# Patient Record
Sex: Male | Born: 1975 | Race: White | Hispanic: No | Smoking: Never smoker
Health system: Southern US, Community
[De-identification: ages and names within clinical notes are randomized; demographics above are authoritative.]

## PROBLEM LIST (undated history)

## (undated) DIAGNOSIS — J45909 Unspecified asthma, uncomplicated: Secondary | ICD-10-CM

## (undated) DIAGNOSIS — J40 Bronchitis, not specified as acute or chronic: Secondary | ICD-10-CM

## (undated) HISTORY — PX: ABDOMINAL SURGERY: SHX537

---

## 2008-02-17 DIAGNOSIS — K429 Umbilical hernia without obstruction or gangrene: Secondary | ICD-10-CM

## 2008-02-17 HISTORY — PX: ABDOMINAL SURGERY: SHX537

## 2008-02-17 HISTORY — PX: UMBILICAL HERNIA REPAIR: SHX196

## 2008-02-17 HISTORY — DX: Umbilical hernia without obstruction or gangrene: K42.9

## 2010-03-06 ENCOUNTER — Ambulatory Visit: Payer: Self-pay | Admitting: Surgery

## 2010-03-11 ENCOUNTER — Ambulatory Visit: Payer: Self-pay | Admitting: Surgery

## 2012-10-22 ENCOUNTER — Emergency Department: Payer: Self-pay | Admitting: Emergency Medicine

## 2013-02-20 ENCOUNTER — Emergency Department: Payer: Self-pay | Admitting: Emergency Medicine

## 2013-02-20 LAB — BASIC METABOLIC PANEL
Anion Gap: 6 — ABNORMAL LOW (ref 7–16)
BUN: 12 mg/dL (ref 7–18)
CHLORIDE: 106 mmol/L (ref 98–107)
CO2: 25 mmol/L (ref 21–32)
Calcium, Total: 8.7 mg/dL (ref 8.5–10.1)
Creatinine: 0.86 mg/dL (ref 0.60–1.30)
EGFR (African American): >60
EGFR (Non-African Amer.): >60
GLUCOSE: 98 mg/dL (ref 65–99)
Osmolality: 274 (ref 275–301)
Potassium: 3.3 mmol/L — ABNORMAL LOW (ref 3.5–5.1)
SODIUM: 137 mmol/L (ref 136–145)

## 2013-02-20 LAB — CBC
HCT: 39.3 % — ABNORMAL LOW (ref 40.0–52.0)
HGB: 14 g/dL (ref 13.0–18.0)
MCH: 29.4 pg (ref 26.0–34.0)
MCHC: 35.6 g/dL (ref 32.0–36.0)
MCV: 83 fL (ref 80–100)
PLATELETS: 253 10*3/uL (ref 150–440)
RBC: 4.76 10*6/uL (ref 4.40–5.90)
RDW: 12.8 % (ref 11.5–14.5)
WBC: 9 10*3/uL (ref 3.8–10.6)

## 2013-02-20 LAB — TROPONIN I: Troponin-I: <0.02 ng/mL

## 2013-02-21 LAB — TROPONIN I

## 2013-02-24 ENCOUNTER — Ambulatory Visit: Payer: Self-pay | Admitting: Cardiovascular Disease

## 2014-03-19 ENCOUNTER — Emergency Department: Payer: Self-pay | Admitting: Emergency Medicine

## 2014-09-21 IMAGING — CR DG FEMUR 2V*R*
1 series · 4 of 4 positions shown · non-contrast
Comparison: none

REASON FOR EXAM: fall, right hip pain
COMMENTS:

[Series 1: t femur proximal ap right · 0.14mm/px · 4 of 4 slices shown]
[im 1/4]
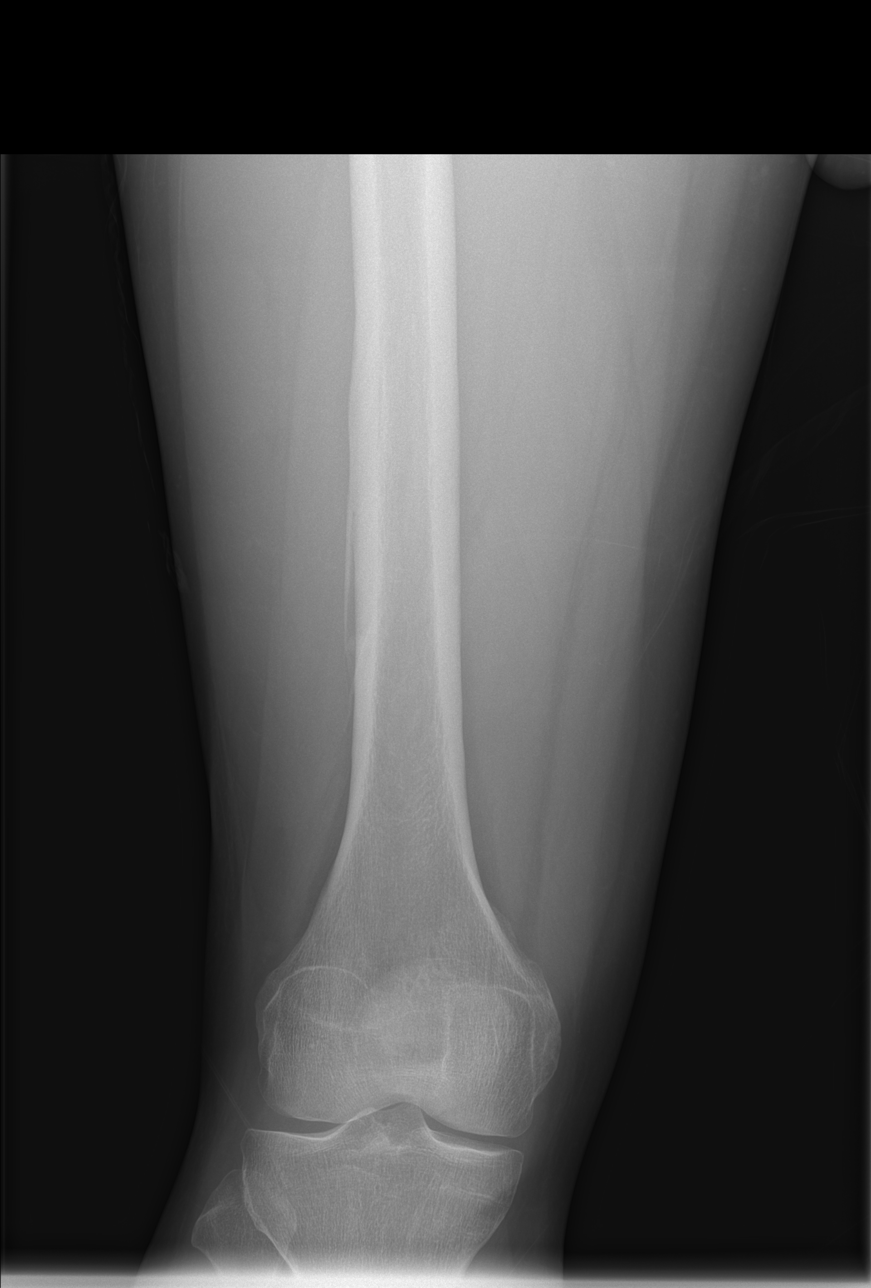
[im 2/4]
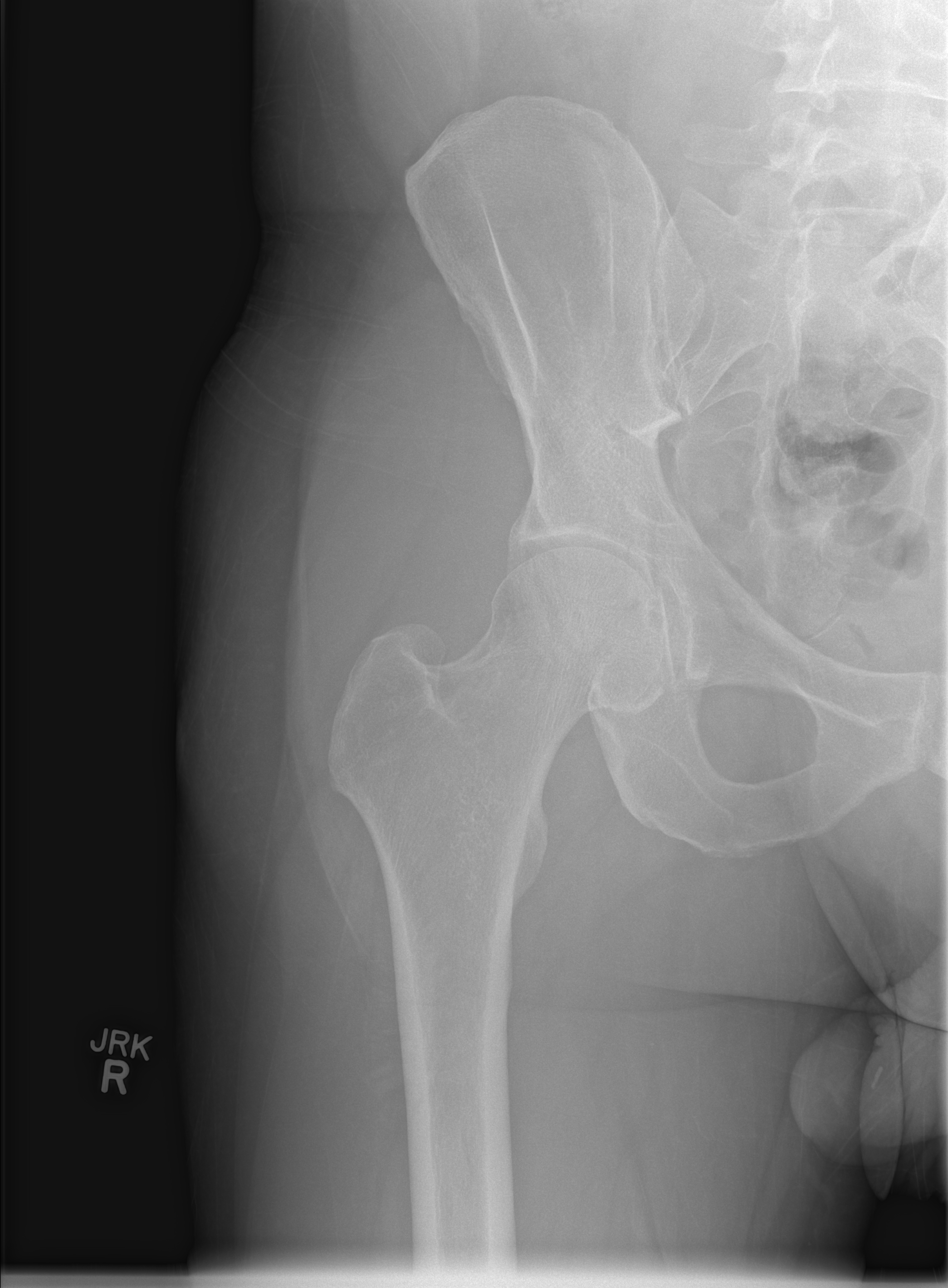
[im 3/4]
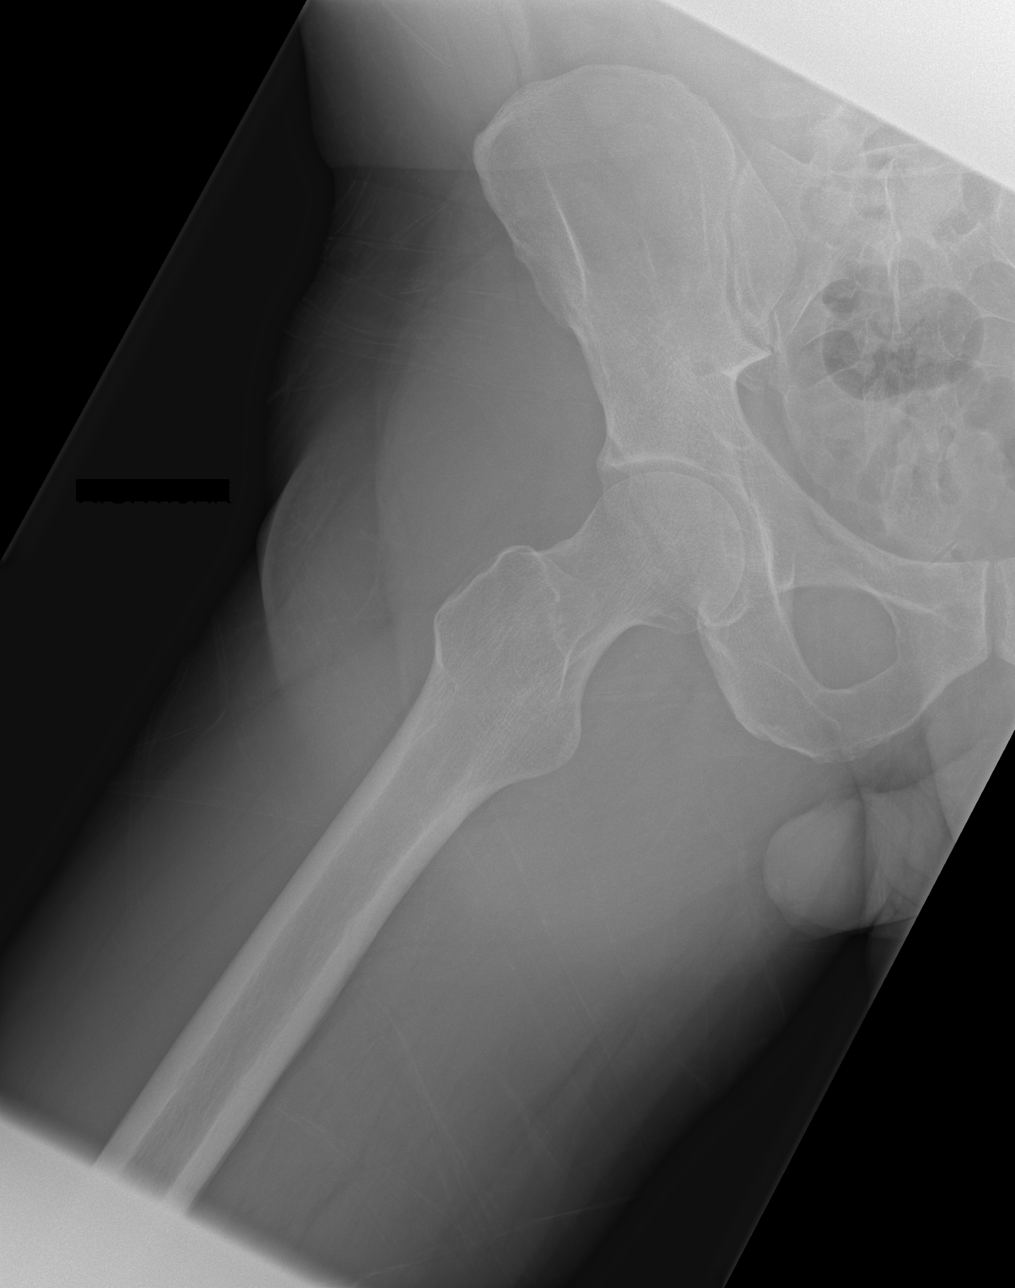
[im 4/4]
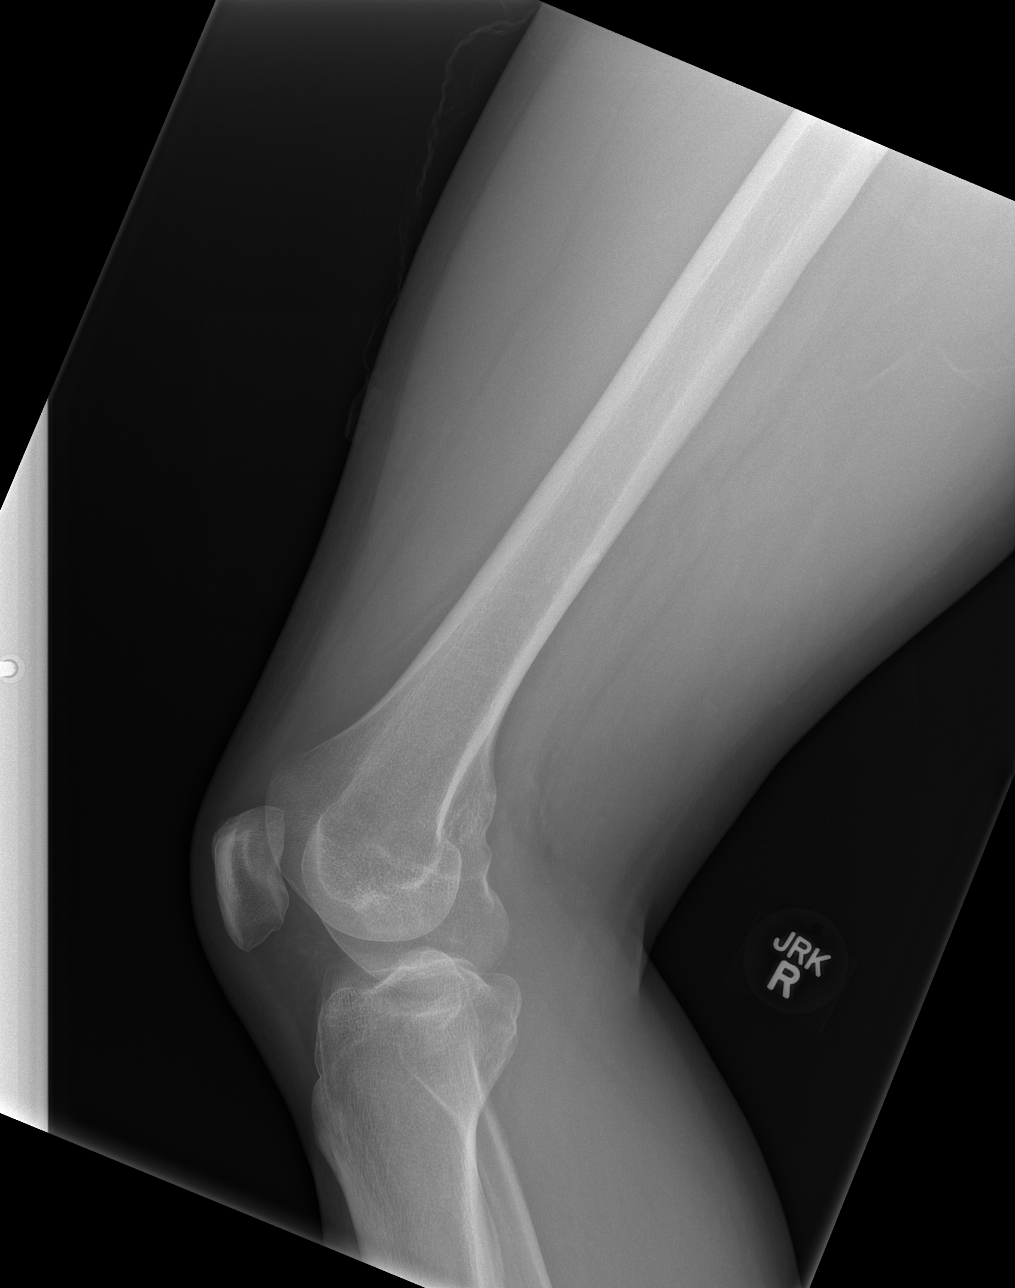

[4 of 4 positions shown; findings below may reference images not displayed]

PROCEDURE:     DXR - DXR FEMUR RIGHT  - October 22, 2012  [DATE]

RESULT:     Images of the right femur demonstrate no dislocation. No
cortical disruption is seen. There is an appearance laterally in the mid to
distal portion of the right femur suggestive of periosteal elevation.
Correlate clinically. No definite abnormality is seen in this region on the
lateral view.
IMPRESSION: Possible periosteal elevation in the distal third of the
lateral aspect of the right femoral shaft.

[REDACTED]

## 2016-04-20 ENCOUNTER — Emergency Department: Payer: Managed Care, Other (non HMO)

## 2016-04-20 ENCOUNTER — Encounter: Payer: Self-pay | Admitting: *Deleted

## 2016-04-20 ENCOUNTER — Emergency Department
Admission: EM | Admit: 2016-04-20 | Discharge: 2016-04-20 | Disposition: A | Discharge status: Home / Self Care | Payer: Managed Care, Other (non HMO) | Attending: Emergency Medicine | Admitting: Emergency Medicine

## 2016-04-20 DIAGNOSIS — J40 Bronchitis, not specified as acute or chronic: Secondary | ICD-10-CM

## 2016-04-20 DIAGNOSIS — R0602 Shortness of breath: Secondary | ICD-10-CM | POA: Diagnosis present

## 2016-04-20 LAB — BASIC METABOLIC PANEL WITH GFR
Anion gap: 4 — ABNORMAL LOW (ref 5–15)
BUN: 13 mg/dL (ref 6–20)
CO2: 28 mmol/L (ref 22–32)
Calcium: 9.2 mg/dL (ref 8.9–10.3)
Chloride: 107 mmol/L (ref 101–111)
Creatinine, Ser: 0.89 mg/dL (ref 0.61–1.24)
GFR calc Af Amer: >60 mL/min
GFR calc non Af Amer: >60 mL/min
Glucose, Bld: 79 mg/dL (ref 65–99)
Potassium: 3.6 mmol/L (ref 3.5–5.1)
Sodium: 139 mmol/L (ref 135–145)

## 2016-04-20 LAB — CBC
HCT: 42.9 % (ref 40.0–52.0)
Hemoglobin: 15 g/dL (ref 13.0–18.0)
MCH: 28.8 pg (ref 26.0–34.0)
MCHC: 35.1 g/dL (ref 32.0–36.0)
MCV: 82.1 fL (ref 80.0–100.0)
Platelets: 213 10*3/uL (ref 150–440)
RBC: 5.22 MIL/uL (ref 4.40–5.90)
RDW: 13.2 % (ref 11.5–14.5)
WBC: 7.9 10*3/uL (ref 3.8–10.6)

## 2016-04-20 LAB — TROPONIN I: Troponin I: <0.03 ng/mL

## 2016-04-20 MED ORDER — AZITHROMYCIN 250 MG PO TABS: ORAL_TABLET | ORAL | 0 refills | Status: DC

## 2016-04-20 MED ORDER — PREDNISONE 10 MG PO TABS: 50.0000 mg | ORAL_TABLET | Freq: Every day | ORAL | 0 refills | Status: DC

## 2016-04-20 MED ORDER — ALBUTEROL SULFATE HFA 108 (90 BASE) MCG/ACT IN AERS: 2.0000 | INHALATION_SPRAY | RESPIRATORY_TRACT | 1 refills | Status: DC | PRN

## 2016-04-20 MED ORDER — PREDNISONE 20 MG PO TABS
60.0000 mg | ORAL_TABLET | Freq: Once | ORAL | Status: AC
  Administered 2016-04-20: 60 mg via ORAL
  Filled 2016-04-20: qty 3

## 2016-04-20 MED ORDER — PHENYLEPHRINE HCL 0.5 % NA SOLN
1.0000 [drp] | Freq: Once | NASAL | Status: AC
  Administered 2016-04-20: 1 [drp] via NASAL
  Filled 2016-04-20: qty 15

## 2016-04-20 MED ORDER — IPRATROPIUM-ALBUTEROL 0.5-2.5 (3) MG/3ML IN SOLN
3.0000 mL | Freq: Once | RESPIRATORY_TRACT | Status: AC
  Administered 2016-04-20: 3 mL via RESPIRATORY_TRACT
  Filled 2016-04-20: qty 3

## 2016-04-20 NOTE — Discharge Instructions (Signed)
Please follow up with the primary care provider for choice for symptoms that are not improving over the week. Return to the emergency department for symptoms that change or worsen if you are unable to schedule an appointment.

## 2016-04-20 NOTE — ED Notes (Signed)
Provider at bedside

## 2016-04-20 NOTE — ED Provider Notes (Signed)
[facility] Emergency Department Provider Note  ____________________________________________  Time seen: Approximately 9:42 PM  I have reviewed the triage vital signs and the nursing notes.   HISTORY  Chief Complaint Shortness of Breath   HPI Casandra DoffingDouglas G Christenson Jr. is a 41 y.o. male who presents to the emergency department for evaluation of shortness of breath and wheezing. Symptoms started about 2 days ago with acute worsening over the past 24 hours. He states shortness of breath worsens with lying flat. He denies history of asthma and does not smoke. He states cough is infrequent and non-productive. He denies fever.  He has not taken any over the counter medications for his symptoms.   No past medical history on file.  There are no active problems to display for this patient.   No past surgical history on file.  Prior to Admission medications   Medication Sig Start Date End Date Taking? Authorizing Provider  albuterol (PROVENTIL HFA;VENTOLIN HFA) 108 (90 Base) MCG/ACT inhaler Inhale 2 puffs into the lungs every 4 (four) hours as needed for wheezing or shortness of breath. 04/20/16   Chinita Pesterari B Jacorian Golaszewski, FNP  azithromycin (ZITHROMAX) 250 MG tablet 2 tablets today, then 1 tablet for the next 4 days. 04/20/16   Chinita Pesterari B Meaghann Choo, FNP  predniSONE (DELTASONE) 10 MG tablet Take 5 tablets (50 mg total) by mouth daily. 04/20/16   Chinita Pesterari B Estefani Bateson, FNP    Allergies Patient has no known allergies.  No family history on file.  Social History Social History  Substance Use Topics  . Smoking status: Never Smoker  . Smokeless tobacco: Never Used  . Alcohol use No    Review of Systems Constitutional: Negative for fever/chills ENT: negative for sore throat. Cardiovascular: Denies chest pain. Respiratory: Positive for shortness of breath. Positive for cough. Gastrointestinal: negative for nausea,  Negative for vomiting.  Negative for diarrhea.  Musculoskeletal: Negative  for body aches Skin: Negative for rash. Neurological: Negative for headaches ____________________________________________   PHYSICAL EXAM:  VITAL SIGNS: ED Triage Vitals  Enc Vitals Group     BP 04/20/16 2049 112/71     Pulse Rate 04/20/16 2049 77     Resp 04/20/16 2049 20     Temp 04/20/16 2049 98.2 F (36.8 C)     Temp Source 04/20/16 2049 Oral     SpO2 04/20/16 2049 95 %     Weight 04/20/16 2051 190 lb (86.2 kg)     Height 04/20/16 2051 5\' 6"  (1.676 m)     Head Circumference --      Peak Flow --      Pain Score 04/20/16 2051 3     Pain Loc --      Pain Edu? --      Excl. in GC? --     Constitutional: Alert and oriented. Well appearing and in no acute distress. Eyes: Conjunctivae are normal. EOMI. Nose: Nasal congestion noted; no rhinnorhea. Mouth/Throat: Mucous membranes are moist.  Oropharynx normal. Tonsils not visualized. Neck: No stridor.  Lymphatic: No cervical lymphadenopathy. Cardiovascular: Normal rate, regular rhythm. Good peripheral circulation. Respiratory: Normal respiratory effort.  No retractions. Decreased breath sounds with scattered wheezing on the left. Breath sounds on the right also diffuse wheezes, but moving more air. Gastrointestinal: Soft and nontender.  Musculoskeletal: FROM x 4 extremities.  Neurologic:  Normal speech and language.  Skin:  Skin is warm, dry and intact. No rash noted. Psychiatric: Mood and affect are normal. Speech and behavior are normal.  ____________________________________________   LABS (all labs ordered are listed, but only abnormal results are displayed)  Labs Reviewed  BASIC METABOLIC PANEL - Abnormal; Notable for the following:       Result Value   Anion gap 4 (*)    All other components within normal limits  CBC  TROPONIN I   ____________________________________________  EKG  Ventricular rate of 73; PR , QRS 84ms, QT 60ms, normal sinus  rhythm ____________________________________________  RADIOLOGY  Chest x-ray without acute findings per radiology. ____________________________________________   PROCEDURES  Procedure(s) performed:   Critical Care performed: No ____________________________________________   INITIAL IMPRESSION / ASSESSMENT AND PLAN / ED COURSE  42 year old male presenting to the emergency department for evaluation of shortness of breath. Symptoms started approximately 10 days ago with increase of orthopnea over the past 24 hours. While in the emergency department, he was given a DuoNeb treatment, 60 mg of prednisone, and a single dose of Afrin for his complaint of nasal congestion after the breathing treatment. He is to follow-up with the primary care provider for his choice for symptoms that are not improving over the week. He is to return to the emergency department for symptoms that change or worsen if he is unable schedule an appointment.  Pertinent labs & imaging results that were available during my care of the patient were reviewed by me and considered in my medical decision making (see chart for details).  New Prescriptions   ALBUTEROL (PROVENTIL HFA;VENTOLIN HFA) 108 (90 BASE) MCG/ACT INHALER    Inhale 2 puffs into the lungs every 4 (four) hours as needed for wheezing or shortness of breath.   AZITHROMYCIN (ZITHROMAX) 250 MG TABLET    2 tablets today, then 1 tablet for the next 4 days.   PREDNISONE (DELTASONE) 10 MG TABLET    Take 5 tablets (50 mg total) by mouth daily.    If controlled substance prescribed during this visit, 12 month history viewed on the [facility] prior to issuing an initial prescription for Schedule II or III opiod. ____________________________________________   FINAL CLINICAL IMPRESSION(S) / ED DIAGNOSES  Final diagnoses:  Bronchitis    Note:  This document was prepared using Dragon voice recognition software and may include unintentional dictation errors.     Chinita Pester, FNP 04/20/16 2255    Jeanmarie Plant, MD 04/20/16 [phone]

## 2016-04-20 NOTE — ED Triage Notes (Signed)
Pt ambulatory to triage  Pt states he is sob for 2days   no chest pain.  Pt states sx worse when lying down.  Dry cough  No n/v/d.  Pt alert.  Speech clear.

## 2016-04-26 ENCOUNTER — Encounter (HOSPITAL_COMMUNITY): Payer: Self-pay | Admitting: Emergency Medicine

## 2016-04-26 ENCOUNTER — Ambulatory Visit (HOSPITAL_COMMUNITY)
Admission: EM | Admit: 2016-04-26 | Discharge: 2016-04-26 | Disposition: A | Discharge status: Home / Self Care | Payer: Managed Care, Other (non HMO) | Attending: Nurse Practitioner | Admitting: Nurse Practitioner

## 2016-04-26 DIAGNOSIS — J209 Acute bronchitis, unspecified: Secondary | ICD-10-CM

## 2016-04-26 MED ORDER — ALBUTEROL SULFATE HFA 108 (90 BASE) MCG/ACT IN AERS: 2.0000 | INHALATION_SPRAY | Freq: Four times a day (QID) | RESPIRATORY_TRACT | 2 refills | Status: DC | PRN

## 2016-04-26 MED ORDER — HYDROCOD POLST-CPM POLST ER 10-8 MG/5ML PO SUER: 5.0000 mL | Freq: Two times a day (BID) | ORAL | 0 refills | Status: AC | PRN

## 2016-04-26 NOTE — Discharge Instructions (Signed)
I believe the you have acute bronchitis today. He states the cough medicine as prescribed. Please take the computer inhaler as needed. Please return if no improvement is noted. He may continue the Delsym and mucinex as well over-the-counter.

## 2016-04-26 NOTE — ED Provider Notes (Signed)
CSN: [id number]     Arrival date & time 04/26/16  1452 History   First MD Initiated Contact with Patient 04/26/16 1524     Chief Complaint  Patient presents with  . Cough   (Consider location/radiation/quality/duration/timing/severity/associated sxs/prior Treatment) Is a well-appearing 41 year old male, with no medical history, presents today for a cough that is not almost 2 weeks accompanied by shortness of breath only at night when he is laying down. She was seen in the ER on 03/05 for the same thing and was diagnosed with bronchitis and was sent home with prednisone. She finished the prednisone today. Patient had a negative chest x-ray at the ER. States that his cough is getting worse. Patient have try over-the-counter Delsym with no relief. He denies shortness of breath currently. He denies chest pain. Nonsmoker. He also denies sore throat, congestion, runny nose or sneezing.  HR is 90 at repeat        History reviewed. No pertinent past medical history. Past Surgical History:  Procedure Laterality Date  . ABDOMINAL SURGERY     History reviewed. No pertinent family history. Social History  Substance Use Topics  . Smoking status: Never Smoker  . Smokeless tobacco: Never Used  . Alcohol use No    Review of Systems  Constitutional:       See history of present illness    Allergies  Patient has no known allergies.  Home Medications   Prior to Admission medications   Medication Sig Start Date End Date Taking? Authorizing Provider  predniSONE (DELTASONE) 10 MG tablet Take 5 tablets (50 mg total) by mouth daily. 04/20/16  Yes Cari B Triplett, FNP  albuterol (PROVENTIL HFA;VENTOLIN HFA) 108 (90 Base) MCG/ACT inhaler Inhale 2 puffs into the lungs every 6 (six) hours as needed for wheezing or shortness of breath. 04/26/16   Lucia Estelle, NP  chlorpheniramine-HYDROcodone [facility] PENNKINETIC ER) 10-8 MG/5ML SUER Take 5 mLs by mouth every 12 (twelve) hours as needed for cough.  04/26/16 05/03/16  Lucia Estelle, NP   Meds Ordered and Administered this Visit  Medications - No data to display  BP 118/78 (BP Location: Right Arm)   Pulse 90   Temp 98.6 F (37 C) (Oral)   SpO2 95%  No data found.   Physical Exam  Constitutional: He is oriented to person, place, and time. He appears well-developed and well-nourished.  HENT:  Head: Normocephalic and atraumatic.  Right Ear: External ear normal.  Left Ear: External ear normal.  Nose: Nose normal.  Mouth/Throat: Oropharynx is clear and moist. No oropharyngeal exudate.  TM pearly gray bilaterally  Eyes: Conjunctivae are normal. Pupils are equal, round, and reactive to light.  Neck: Normal range of motion.  Cardiovascular: Normal rate, regular rhythm and normal heart sounds.   Pulmonary/Chest: Effort normal and breath sounds normal. No respiratory distress. He has no wheezes.  Abdominal: Soft. Bowel sounds are normal. He exhibits no distension. There is no tenderness.  Neurological: He is alert and oriented to person, place, and time.  Skin: Skin is warm and dry. He is not diaphoretic.  Nursing note and vitals reviewed.   Urgent Care Course     Procedures (including critical care time)  Labs Review Labs Reviewed - No data to display  Imaging Review No results found.  MDM   1. Acute bronchitis, unspecified organism    Chest x-ray was negative at the ER. There is no indication for repeat chest x-ray today. Patient's symptoms are most consistent with acute bronchitis.  Education provided on acute bronchitis.  Please start the cough medicine as prescribed. Also do the albuterol inhaler as needed for wheezing or shortness of breath. Please return or go to the ER if SOB gets worst.    Lucia EstelleFeng Judithann Villamar, NP 04/26/16 1538

## 2016-04-26 NOTE — ED Triage Notes (Signed)
Patient presents to [facility] with Cough and Congestion went to [facility] on Monday. He states they gave him an inhaler and some steroids, patient reports symptoms worsened. Patient states he had a low grade fever.   A and O x 4

## 2016-12-27 ENCOUNTER — Encounter: Payer: Self-pay | Admitting: Emergency Medicine

## 2016-12-27 ENCOUNTER — Emergency Department
Admission: EM | Admit: 2016-12-27 | Discharge: 2016-12-27 | Disposition: A | Discharge status: Home / Self Care | Payer: Managed Care, Other (non HMO) | Attending: Emergency Medicine | Admitting: Emergency Medicine

## 2016-12-27 ENCOUNTER — Emergency Department: Payer: Managed Care, Other (non HMO)

## 2016-12-27 DIAGNOSIS — J40 Bronchitis, not specified as acute or chronic: Secondary | ICD-10-CM | POA: Diagnosis not present

## 2016-12-27 DIAGNOSIS — R062 Wheezing: Secondary | ICD-10-CM | POA: Diagnosis present

## 2016-12-27 HISTORY — DX: Bronchitis, not specified as acute or chronic: J40

## 2016-12-27 LAB — TROPONIN I

## 2016-12-27 LAB — CBC WITH DIFFERENTIAL/PLATELET
BASOS ABS: 0 10*3/uL (ref 0–0.1)
BASOS PCT: 0 %
EOS ABS: 0.7 10*3/uL (ref 0–0.7)
Eosinophils Relative: 11 %
HEMATOCRIT: 42.5 % (ref 40.0–52.0)
Hemoglobin: 14.5 g/dL (ref 13.0–18.0)
Lymphocytes Relative: 24 %
Lymphs Abs: 1.6 10*3/uL (ref 1.0–3.6)
MCH: 28.9 pg (ref 26.0–34.0)
MCHC: 34.2 g/dL (ref 32.0–36.0)
MCV: 84.5 fL (ref 80.0–100.0)
MONO ABS: 0.5 10*3/uL (ref 0.2–1.0)
Monocytes Relative: 8 %
NEUTROS ABS: 3.6 10*3/uL (ref 1.4–6.5)
NEUTROS PCT: 57 %
PLATELETS: 212 10*3/uL (ref 150–440)
RBC: 5.02 MIL/uL (ref 4.40–5.90)
RDW: 12.9 % (ref 11.5–14.5)
WBC: 6.4 10*3/uL (ref 3.8–10.6)

## 2016-12-27 LAB — BASIC METABOLIC PANEL
ANION GAP: 9 (ref 5–15)
BUN: 14 mg/dL (ref 6–20)
CALCIUM: 8.9 mg/dL (ref 8.9–10.3)
CO2: 25 mmol/L (ref 22–32)
Chloride: 104 mmol/L (ref 101–111)
Creatinine, Ser: 1 mg/dL (ref 0.61–1.24)
Glucose, Bld: 93 mg/dL (ref 65–99)
Potassium: 3.7 mmol/L (ref 3.5–5.1)
SODIUM: 138 mmol/L (ref 135–145)

## 2016-12-27 MED ORDER — IPRATROPIUM-ALBUTEROL 0.5-2.5 (3) MG/3ML IN SOLN
3.0000 mL | Freq: Once | RESPIRATORY_TRACT | Status: AC
  Administered 2016-12-27: 3 mL via RESPIRATORY_TRACT

## 2016-12-27 MED ORDER — IPRATROPIUM-ALBUTEROL 0.5-2.5 (3) MG/3ML IN SOLN
RESPIRATORY_TRACT | Status: AC
  Administered 2016-12-27: 3 mL via RESPIRATORY_TRACT
  Filled 2016-12-27: qty 6

## 2016-12-27 MED ORDER — PREDNISONE 20 MG PO TABS: 60.0000 mg | ORAL_TABLET | Freq: Every day | ORAL | 0 refills | Status: AC

## 2016-12-27 MED ORDER — PREDNISONE 20 MG PO TABS
60.0000 mg | ORAL_TABLET | Freq: Once | ORAL | Status: AC
  Administered 2016-12-27: 60 mg via ORAL

## 2016-12-27 MED ORDER — ALBUTEROL SULFATE HFA 108 (90 BASE) MCG/ACT IN AERS: 2.0000 | INHALATION_SPRAY | Freq: Four times a day (QID) | RESPIRATORY_TRACT | 2 refills | Status: AC | PRN

## 2016-12-27 MED ORDER — PREDNISONE 20 MG PO TABS
ORAL_TABLET | ORAL | Status: AC
  Administered 2016-12-27: 60 mg via ORAL
  Filled 2016-12-27: qty 3

## 2016-12-27 NOTE — ED Provider Notes (Signed)
[facility] Emergency Department Provider Note  ____________________________________________  Time seen: Approximately 9:35 PM  I have reviewed the triage vital signs and the nursing notes.   HISTORY  Chief Complaint No chief complaint on file.   HPI Jeffery DoffingDouglas G Boulanger Jr. is a 41 y.o. male no significant past medical history who presents for evaluation of wheezing and shortness of breath. Patient reports that 5 months ago he had similar symptoms and he was told he had bronchitis. He was given an inhaler. He has been using the inhaler which helps some with his shortness of breath. He has had shortness of breath that is mild and constant with wheezing for 2 weeks. For the last 4 days has had a cough productive of yellow sputum. No fever or chills. Has had intermittent chest tightness which is responsive to the albuterol inhaler. No nausea, vomiting, diarrhea, abdominal pain. Patient denies any history of smoking, asthma, or COPD.  Past Medical History:  Diagnosis Date  . Bronchitis     There are no active problems to display for this patient.   Past Surgical History:  Procedure Laterality Date  . ABDOMINAL SURGERY      Prior to Admission medications   Medication Sig Start Date End Date Taking? Authorizing Provider  albuterol (PROVENTIL HFA;VENTOLIN HFA) 108 (90 Base) MCG/ACT inhaler Inhale 2 puffs every 6 (six) hours as needed into the lungs for wheezing or shortness of breath. 12/27/16   Nita SickleVeronese, [place], MD  predniSONE (DELTASONE) 20 MG tablet Take 3 tablets (60 mg total) daily for 4 days by mouth. 12/27/16 12/31/16  Nita SickleVeronese, [place], MD    Allergies Patient has no known allergies.  History reviewed. No pertinent family history.  Social History Social History   Tobacco Use  . Smoking status: Never Smoker  . Smokeless tobacco: Never Used  Substance Use Topics  . Alcohol use: No  . Drug use: No    Review of Systems  Constitutional:  Negative for fever. Eyes: Negative for visual changes. ENT: Negative for sore throat. Neck: No neck pain  Cardiovascular: Negative for chest pain. Respiratory: + shortness of breath, cough, wheezing Gastrointestinal: Negative for abdominal pain, vomiting or diarrhea. Genitourinary: Negative for dysuria. Musculoskeletal: Negative for back pain. Skin: Negative for rash. Neurological: Negative for headaches, weakness or numbness. Psych: No SI or HI  ____________________________________________   PHYSICAL EXAM:  VITAL SIGNS: ED Triage Vitals  Enc Vitals Group     BP 12/27/16 1933 128/78     Pulse Rate 12/27/16 1933 85     Resp 12/27/16 1933 18     Temp 12/27/16 1933 98 F (36.7 C)     Temp Source 12/27/16 1933 Oral     SpO2 12/27/16 1933 100 %     Weight 12/27/16 1933 190 lb (86.2 kg)     Height 12/27/16 1933 5\' 7"  (1.702 m)     Head Circumference --      Peak Flow --      Pain Score 12/27/16 1932 5     Pain Loc --      Pain Edu? --      Excl. in GC? --     Constitutional: Alert and oriented. Well appearing and in no apparent distress. HEENT:      Head: Normocephalic and atraumatic.         Eyes: Conjunctivae are normal. Sclera is non-icteric.       Mouth/Throat: Mucous membranes are moist.       Neck:  Supple with no signs of meningismus. Cardiovascular: Regular rate and rhythm. No murmurs, gallops, or rubs. 2+ symmetrical distal pulses are present in all extremities. No JVD. Respiratory: Normal respiratory effort. Slightly diminished air movement with faint expiratory wheezes  Gastrointestinal: Soft, non tender, and non distended with positive bowel sounds. No rebound or guarding. Musculoskeletal: Nontender with normal range of motion in all extremities. No edema, cyanosis, or erythema of extremities. Neurologic: Normal speech and language. Face is symmetric. Moving all extremities. No gross focal neurologic deficits are appreciated. Skin: Skin is warm, dry and intact.  No rash noted. Psychiatric: Mood and affect are normal. Speech and behavior are normal.  ____________________________________________   LABS (all labs ordered are listed, but only abnormal results are displayed)  Labs Reviewed  TROPONIN I  CBC WITH DIFFERENTIAL/PLATELET  BASIC METABOLIC PANEL   ____________________________________________  EKG  ED ECG REPORT I, Nita Sicklearolina Korey Prashad, the attending physician, personally viewed and interpreted this ECG.  Normal sinus rhythm, rate of 80, normal intervals, normal axis, no ST elevations or depressions. Unchanged from prior from March 2018  ____________________________________________  RADIOLOGY  CXR: negative ____________________________________________   PROCEDURES  Procedure(s) performed: None Procedures Critical Care performed:  None ____________________________________________   INITIAL IMPRESSION / ASSESSMENT AND PLAN / ED COURSE  41 y.o. male no significant past medical history who presents for evaluation of productive cough, wheezing and shortness of breath. Patient is well-appearing, normal work of breathing, normal sats, he does have a faint expiratory wheezes. PERC negative. No h/o smoking, asthma, COPD however has not seen a doctor in many years. CXR with no evidence of pneumonia, CHF, pneumothorax. EKG with no evidence of ischemia. We'll give a DuoNeb and prednisone and reassess. If patient responds to treatment we'll refer him back to his primary care doctor for further evaluation of possible COPD/asthma    _________________________ 9:44 PM on 12/27/2016 -----------------------------------------  Patient received DuoNeb 2 and prednisone with full resolution of his shortness of breath and wheezing. He is moving good air. At this time, with normal labs, chest x-ray, normal vitals, normal work of breathing, and resolution of his symptoms I feel that is safe for patient to be discharged and continue his management as an  outpatient. [facility] to evaluate for any evidence of COPD or asthma. I will provide patient with prescription for prednisone and albuterol inhaler. I have dicussed my customary return precautions with patient.   As part of my medical decision making, I reviewed the following data within the electronic MEDICAL RECORD NUMBER Nursing notes reviewed and incorporated, Labs reviewed , EKG interpreted , Old EKG reviewed, Old chart reviewed, Radiograph reviewed , Notes from prior ED visits and [place] Controlled Substance Database    Pertinent labs & imaging results that were available during my care of the patient were reviewed by me and considered in my medical decision making (see chart for details).    ____________________________________________   FINAL CLINICAL IMPRESSION(S) / ED DIAGNOSES  Final diagnoses:  Bronchitis      NEW MEDICATIONS STARTED DURING THIS VISIT:  This SmartLink is deprecated. Use AVSMEDLIST instead to display the medication list for a patient.   Note:  This document was prepared using Dragon voice recognition software and may include unintentional dictation errors.    Nita SickleVeronese, [place], MD 12/27/16 [phone]

## 2016-12-27 NOTE — ED Notes (Signed)
Pt in with co cough and congestion x 4-5 days, denies a fever. Hx of bronchitis no hx of smoking, has been taking otc cough meds without relief and inhalers that were prescribed a few months ago for bronchitis.

## 2016-12-27 NOTE — ED Triage Notes (Signed)
Pt with nasal congestion noted. Pt states he feels short of breath and has been having intermittent wheezing at night. Pt with strong frequent cough noted. Pt also complains of chest discomfort with shortness of breath. Pt appears in no acute distress.

## 2017-02-16 DIAGNOSIS — J45909 Unspecified asthma, uncomplicated: Secondary | ICD-10-CM

## 2017-02-16 HISTORY — DX: Unspecified asthma, uncomplicated: J45.909

## 2018-12-08 ENCOUNTER — Ambulatory Visit (INDEPENDENT_AMBULATORY_CARE_PROVIDER_SITE_OTHER): Payer: Self-pay

## 2018-12-08 ENCOUNTER — Other Ambulatory Visit: Payer: Self-pay

## 2018-12-08 ENCOUNTER — Encounter (HOSPITAL_COMMUNITY): Payer: Self-pay

## 2018-12-08 ENCOUNTER — Ambulatory Visit (HOSPITAL_COMMUNITY)
Admission: EM | Admit: 2018-12-08 | Discharge: 2018-12-08 | Disposition: A | Discharge status: Home / Self Care | Payer: Self-pay | Attending: Family Medicine | Admitting: Family Medicine

## 2018-12-08 DIAGNOSIS — J4520 Mild intermittent asthma, uncomplicated: Secondary | ICD-10-CM | POA: Diagnosis present

## 2018-12-08 DIAGNOSIS — W19XXXA Unspecified fall, initial encounter: Secondary | ICD-10-CM

## 2018-12-08 DIAGNOSIS — S2231XD Fracture of one rib, right side, subsequent encounter for fracture with routine healing: Secondary | ICD-10-CM

## 2018-12-08 MED ORDER — IBUPROFEN 800 MG PO TABS: 800.0000 mg | ORAL_TABLET | Freq: Three times a day (TID) | ORAL | 0 refills | Status: DC | PRN

## 2018-12-08 MED ORDER — HYDROCODONE-ACETAMINOPHEN 7.5-325 MG PO TABS: 1.0000 | ORAL_TABLET | Freq: Four times a day (QID) | ORAL | 0 refills | Status: DC | PRN

## 2018-12-08 NOTE — ED Provider Notes (Signed)
[facility]    CSN: [id number] Arrival date & time: 12/08/18  0808      History   Chief Complaint Chief Complaint  Patient presents with  . Fall    HPI Jeffery Carlson. is a 43 y.o. male.   HPI  Healthy 43 year old non-smoker.  Past medical history of asthma, intermittent, with bronchitis periodically.  He has a albuterol inhaler that he uses infrequently. While at work on Saturday, 12/03/2018 he was leaning over a metal wall and the ladder slid out from underneath him causing him to fall suddenl on the wall edge, it hit across his lower ribs on the right side.  It was immediately painful.  Ever since then his ribs have hurt with deep breath and with movement.  He states he sneezed on Sunday and had a sudden increase in his pain.  Now it hurts even at rest.  He is having difficulty sleeping. No shortness of breath.  No cough.  No sputum.  No fever.  Past Medical History:  Diagnosis Date  . Bronchitis     Patient Active Problem List   Diagnosis Date Noted  . Intermittent asthma without complication 96/22/2979    Past Surgical History:  Procedure Laterality Date  . ABDOMINAL SURGERY         Home Medications    Prior to Admission medications   Medication Sig Start Date End Date Taking? Authorizing Provider  albuterol (PROVENTIL HFA;VENTOLIN HFA) 108 (90 Base) MCG/ACT inhaler Inhale 2 puffs every 6 (six) hours as needed into the lungs for wheezing or shortness of breath. 12/27/16   Rudene Re, MD  HYDROcodone-acetaminophen [facility]) 7.5-325 MG tablet Take 1 tablet by mouth every 6 (six) hours as needed for moderate pain. 12/08/18   Raylene Everts, MD  ibuprofen (ADVIL) 800 MG tablet Take 1 tablet (800 mg total) by mouth every 8 (eight) hours as needed for moderate pain. 12/08/18   Raylene Everts, MD    Family History Family History  Problem Relation Age of Onset  . Heart failure Mother   . Diabetes Father     Social History Social  History   Tobacco Use  . Smoking status: Never Smoker  . Smokeless tobacco: Never Used  Substance Use Topics  . Alcohol use: No  . Drug use: No     Allergies   Patient has no known allergies.   Review of Systems Review of Systems  Constitutional: Negative for chills and fever.  HENT: Negative for ear pain and sore throat.   Eyes: Negative for pain and visual disturbance.  Respiratory: Positive for chest tightness. Negative for cough and shortness of breath.        Unable to take a deep breath secondary to rib pain  Cardiovascular: Positive for chest pain. Negative for palpitations.  Gastrointestinal: Negative for abdominal pain and vomiting.  Genitourinary: Negative for dysuria and hematuria.  Musculoskeletal: Negative for arthralgias and back pain.  Skin: Negative for color change and rash.  Neurological: Negative for seizures and syncope.  All other systems reviewed and are negative.    Physical Exam Triage Vital Signs ED Triage Vitals  Enc Vitals Group     BP 12/08/18 0829 115/78     Pulse Rate 12/08/18 0829 84     Resp 12/08/18 0829 18     Temp 12/08/18 0829 99.4 F (37.4 C)     Temp Source 12/08/18 0829 Oral     SpO2 12/08/18 0829 98 %  Weight 12/08/18 0826 190 lb (86.2 kg)     Height --      Head Circumference --      Peak Flow --      Pain Score 12/08/18 0826 10     Pain Loc --      Pain Edu? --      Excl. in GC? --    No data found.  Updated Vital Signs BP 115/78 (BP Location: Right Arm)   Pulse 84   Temp 99.4 F (37.4 C) (Oral)   Resp 18   Wt 86.2 kg   SpO2 98%   BMI 29.76 kg/m    Physical Exam Constitutional:      General: He is not in acute distress.    Appearance: He is well-developed and normal weight.     Comments: Appears uncomfortable.  Guarded movements  HENT:     Head: Normocephalic and atraumatic.     Mouth/Throat:     Comments: Mask in place Eyes:     Conjunctiva/sclera: Conjunctivae normal.     Pupils: Pupils are  equal, round, and reactive to light.  Neck:     Musculoskeletal: Normal range of motion.  Cardiovascular:     Rate and Rhythm: Normal rate and regular rhythm.     Heart sounds: Normal heart sounds.  Pulmonary:     Effort: Pulmonary effort is normal. No respiratory distress.     Breath sounds: Normal breath sounds.     Comments: No bruising of chest wall.  Tenderness in the midaxillary line at the lower rib border.  Lungs are clear throughout Abdominal:     General: There is no distension.     Palpations: Abdomen is soft.  Musculoskeletal: Normal range of motion.  Skin:    General: Skin is warm and dry.  Neurological:     General: No focal deficit present.     Mental Status: He is alert.  Psychiatric:        Mood and Affect: Mood normal.        Behavior: Behavior normal.      UC Treatments / Results  Labs (all labs ordered are listed, but only abnormal results are displayed) Labs Reviewed - No data to display  EKG   Radiology Dg Ribs Unilateral W/chest Right  Result Date: 12/08/2018 CLINICAL DATA:  Fall from ladder 5 days ago with persistent right-sided chest pain, initial encounter EXAM: RIGHT RIBS AND CHEST - 3+ VIEW COMPARISON:  12/28/2016 FINDINGS: Cardiac shadow is within normal limits. The lungs are well aerated bilaterally. No definitive pneumothorax is seen. Undisplaced right ninth rib fracture is noted anterior laterally. No other fractures are seen. IMPRESSION: Undisplaced right ninth rib fracture without complicating factors. Electronically Signed   By: Alcide CleverMark  Lukens M.D.   On: 12/08/2018 09:11    Procedures Procedures (including critical care time)  Medications Ordered in UC Medications - No data to display  Initial Impression / Assessment and Plan / UC Course  I have reviewed the triage vital signs and the nursing notes.  Pertinent labs & imaging results that were available during my care of the patient were reviewed by me and considered in my medical  decision making (see chart for details).     Explained to patient that he has a rib fracture.  Time to heal reviewed.  Activities to avoid.  Prevent pneumonia. Final Clinical Impressions(s) / UC Diagnoses   Final diagnoses:  Traumatic closed nondisplaced fracture of one rib with routine healing, right  Fall, initial encounter     Discharge Instructions     Take the ibuprofen 2 x a day with food Take the pain medicine if needed Do not drive on the hydrocodone Take deep breaths several times a day to expand your lungs Watch for pneumonia.   Seek medical care right away for cough, fever or sputum production    ED Prescriptions    Medication Sig Dispense Auth. Provider   ibuprofen (ADVIL) 800 MG tablet Take 1 tablet (800 mg total) by mouth every 8 (eight) hours as needed for moderate pain. 90 tablet Eustace Moore, MD   HYDROcodone-acetaminophen [facility]) 7.5-325 MG tablet Take 1 tablet by mouth every 6 (six) hours as needed for moderate pain. 15 tablet Eustace Moore, MD     I have reviewed the PDMP during this encounter.   Eustace Moore, MD 12/08/18 [phone]

## 2018-12-08 NOTE — ED Triage Notes (Signed)
Pt states he fell on a metal wall and his right side rib area hurts. Pt states he sneezed on Saturday the pain got worst. The fall happen on Saturday.

## 2018-12-08 NOTE — Discharge Instructions (Signed)
Take the ibuprofen 2 x a day with food Take the pain medicine if needed Do not drive on the hydrocodone Take deep breaths several times a day to expand your lungs Watch for pneumonia.   Seek medical care right away for cough, fever or sputum production

## 2019-07-13 ENCOUNTER — Emergency Department: Payer: No Typology Code available for payment source

## 2019-07-13 ENCOUNTER — Encounter: Payer: Self-pay | Admitting: Emergency Medicine

## 2019-07-13 ENCOUNTER — Other Ambulatory Visit: Payer: Self-pay

## 2019-07-13 ENCOUNTER — Emergency Department
Admission: EM | Admit: 2019-07-13 | Discharge: 2019-07-13 | Disposition: A | Discharge status: Home / Self Care | Payer: No Typology Code available for payment source | Attending: Emergency Medicine | Admitting: Emergency Medicine

## 2019-07-13 DIAGNOSIS — Y999 Unspecified external cause status: Secondary | ICD-10-CM | POA: Diagnosis not present

## 2019-07-13 DIAGNOSIS — Y9389 Activity, other specified: Secondary | ICD-10-CM | POA: Insufficient documentation

## 2019-07-13 DIAGNOSIS — Y92410 Unspecified street and highway as the place of occurrence of the external cause: Secondary | ICD-10-CM | POA: Insufficient documentation

## 2019-07-13 DIAGNOSIS — S29019A Strain of muscle and tendon of unspecified wall of thorax, initial encounter: Secondary | ICD-10-CM | POA: Diagnosis not present

## 2019-07-13 DIAGNOSIS — Z79899 Other long term (current) drug therapy: Secondary | ICD-10-CM | POA: Diagnosis not present

## 2019-07-13 DIAGNOSIS — S3992XA Unspecified injury of lower back, initial encounter: Secondary | ICD-10-CM | POA: Diagnosis present

## 2019-07-13 DIAGNOSIS — S39012A Strain of muscle, fascia and tendon of lower back, initial encounter: Secondary | ICD-10-CM | POA: Diagnosis not present

## 2019-07-13 MED ORDER — NAPROXEN 500 MG PO TABS: 500.0000 mg | ORAL_TABLET | Freq: Two times a day (BID) | ORAL | 0 refills | Status: DC

## 2019-07-13 MED ORDER — METHOCARBAMOL 500 MG PO TABS: 500.0000 mg | ORAL_TABLET | Freq: Four times a day (QID) | ORAL | 0 refills | Status: DC

## 2019-07-13 MED ORDER — HYDROCODONE-ACETAMINOPHEN 5-325 MG PO TABS: 1.0000 | ORAL_TABLET | Freq: Four times a day (QID) | ORAL | 0 refills | Status: DC | PRN

## 2019-07-13 NOTE — Discharge Instructions (Signed)
Follow-up with your primary care provider or [facility] acute care if any continued problems.  Ice or heat to your muscles as needed for discomfort.  Begin taking medication only as directed.  The hydrocodone is for pain and the methocarbamol is a muscle relaxant.  Do not take both these medications and drive or operate machinery as it may cause drowsiness and increase your risk for injury.  The naproxen is an anti-inflammatory and can be taken twice a day without any risk of drowsiness.  Even with medication you can expect to be sore for several days.  Try to move as much as possible to decrease the amount of soreness in your muscles.
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
## 2020-02-17 DIAGNOSIS — K4020 Bilateral inguinal hernia, without obstruction or gangrene, not specified as recurrent: Secondary | ICD-10-CM

## 2020-02-17 HISTORY — DX: Bilateral inguinal hernia, without obstruction or gangrene, not specified as recurrent: K40.20

## 2020-05-14 ENCOUNTER — Ambulatory Visit (INDEPENDENT_AMBULATORY_CARE_PROVIDER_SITE_OTHER): Payer: BC Managed Care – PPO | Admitting: Surgery

## 2020-05-14 ENCOUNTER — Ambulatory Visit: Payer: Self-pay | Admitting: Surgery

## 2020-05-14 ENCOUNTER — Encounter: Payer: Self-pay | Admitting: Surgery

## 2020-05-14 ENCOUNTER — Other Ambulatory Visit: Payer: Self-pay

## 2020-05-14 VITALS — BP 126/87 | HR 74 | Temp 98.0°F | Ht 66.0 in | Wt 208.8 lb

## 2020-05-14 DIAGNOSIS — Z8719 Personal history of other diseases of the digestive system: Secondary | ICD-10-CM

## 2020-05-14 DIAGNOSIS — Z9889 Other specified postprocedural states: Secondary | ICD-10-CM | POA: Insufficient documentation

## 2020-05-14 DIAGNOSIS — K4020 Bilateral inguinal hernia, without obstruction or gangrene, not specified as recurrent: Secondary | ICD-10-CM

## 2020-05-14 HISTORY — DX: Personal history of other diseases of the digestive system: Z87.19

## 2020-05-14 NOTE — Progress Notes (Signed)
Patient ID: Jeffery Carlson., male   DOB: Jun 17, 1975, 45 y.o.   MRN: [id number]  Chief Complaint: Right sided hernia  History of Present Illness Jeffery Carlson. is a 45 y.o. male with complaints of a right sided inguinal hernia for 5 to 6 years.  Reports over the last year it has begun to bulge out more and become more pop problematic.  It appears to get worse with prolonged standing, denying any exacerbations with lifting, coughing or sneezing.  He reports sitting and resting alleviates the pain.  Denies any history of nausea, vomiting, fevers or chills.  He reports at its worst the pain as a 10.  He denies any difficulty voiding.  Denies any history of constipation.  Past Medical History Past Medical History:  Diagnosis Date  . Bronchitis   . History of umbilical hernia repair with mesh 05/14/2020      Past Surgical History:  Procedure Laterality Date  . ABDOMINAL SURGERY      No Known Allergies  Current Outpatient Medications  Medication Sig Dispense Refill  . albuterol (PROVENTIL HFA;VENTOLIN HFA) 108 (90 Base) MCG/ACT inhaler Inhale 2 puffs every 6 (six) hours as needed into the lungs for wheezing or shortness of breath. 1 Inhaler 2  . methocarbamol (ROBAXIN) 500 MG tablet Take 1 tablet (500 mg total) by mouth 4 (four) times daily. 12 tablet 0   No current facility-administered medications for this visit.    Family History Family History  Problem Relation Age of Onset  . Heart failure Mother   . Diabetes Father       Social History Social History   Tobacco Use  . Smoking status: Never Smoker  . Smokeless tobacco: Never Used  Vaping Use  . Vaping Use: Never used  Substance Use Topics  . Alcohol use: No  . Drug use: No        Review of Systems  Constitutional: Negative.   HENT: Negative.   Eyes: Negative.   Respiratory: Negative.   Cardiovascular: Negative.   Gastrointestinal: Negative.   Genitourinary: Negative.   Skin: Negative.   Neurological:  Negative.   Psychiatric/Behavioral: Negative.       Physical Exam Blood pressure 126/87, pulse 74, temperature 98 F (36.7 C), temperature source Oral, height 5\' 6"  (1.676 m), weight 208 lb 12.8 oz (94.7 kg), SpO2 98 %. Last Weight  Most recent update: 05/14/2020 11:17 AM   Weight  94.7 kg (208 lb 12.8 oz)            CONSTITUTIONAL: Well developed, and nourished, appropriately responsive and aware without distress.   EYES: Sclera non-icteric.   EARS, NOSE, MOUTH AND THROAT:  No appreciable oropharyngeal lesions.   Hearing is intact to voice.  NECK: Trachea is midline, and there is no jugular venous distension.  LYMPH NODES:  Lymph nodes in the neck are not enlarged. RESPIRATORY:  Lungs are clear, and breath sounds are equal bilaterally. Normal respiratory effort without pathologic use of accessory muscles. CARDIOVASCULAR: Heart is regular in rate and rhythm. GI: The abdomen is soft, nontender, and nondistended.  Umbilical scar from prior umbilical hernia repair.  There were no palpable masses. I did not appreciate hepatosplenomegaly. There were normal bowel sounds. GU: Readily palpable right sided inguinal hernia, left-sided inguinal hernia also appreciated. MUSCULOSKELETAL:  Symmetrical muscle tone appreciated in all four extremities.    SKIN: Skin turgor is normal. No pathologic skin lesions appreciated.  NEUROLOGIC:  Motor and sensation appear grossly normal.  Cranial  nerves are grossly without defect. PSYCH:  Alert and oriented to person, place and time. Affect is appropriate for situation.  Data Reviewed I have personally reviewed what is currently available of the patient's imaging, recent labs and medical records.   Labs:  CBC Latest Ref Rng & Units 12/27/2016 04/20/2016 02/20/2013  WBC 3.8 - 10.6 K/uL 6.4 7.9 9.0  Hemoglobin 13.0 - 18.0 g/dL 90.3 00.9 23.3  Hematocrit 40.0 - 52.0 % 42.5 42.9 39.3(L)  Platelets 150 - 440 K/uL 212 213 253   CMP Latest Ref Rng & Units 12/27/2016  04/20/2016 02/20/2013  Glucose 65 - 99 mg/dL 93 79 98  BUN 6 - 20 mg/dL 14 13 12   Creatinine 0.61 - 1.24 mg/dL 0.07 6.22  Sodium 135 - 145 mmol/L 138 139 137  Potassium 3.5 - 5.1 mmol/L 3.7 3.6 3.3(L)  Chloride 101 - 111 mmol/L 104 107 106  CO2 22 - 32 mmol/L 25 28 25   Calcium 8.9 - 10.3 mg/dL 8.9 9.2 8.7      Imaging:  Within last 24 hrs: No results found.  Assessment     Patient Active Problem List   Diagnosis Date Noted  . History of umbilical hernia repair with mesh 05/14/2020  . Bilateral recurrent inguinal hernia without obstruction or gangrene 05/14/2020  . Intermittent asthma without complication 12/08/2018    Plan    Robotic bilateral inguinal hernia repairs.  I discussed possibility of incarceration, strangulation, enlargement in size over time, and the need for emergency surgery in the face of these.  Also reviewed the techniques of reduction should incarceration occur, and when unsuccessful to present to the ED.  Also discussed that surgery risks include recurrence which can be up to 30% in the case of complex hernias, use of prosthetic materials (mesh) and the increased risk of infection and the possible need for re-operation and removal of mesh, possibility of post-op SBO or ileus, and the risks of general anesthetic including heart attack, stroke, sudden death or some reaction to anesthetic medications. The patient, and those present, appear to understand the risks, any and all questions were answered to the patient's satisfaction.  No guarantees were ever expressed or implied.  Face-to-face time spent with the patient and accompanying care providers(if present) was 35 minutes, with more than 50% of the time spent counseling, educating, and coordinating care of the patient.      05/16/2020 M.D., FACS 05/14/2020, 12:14 PM

## 2020-05-14 NOTE — Patient Instructions (Addendum)
Our surgery scheduler Barbara will call you within 24-48 hours to get you scheduled. If you have not heard from her after 48 hours, please call our office. You will need to get Covid tested before surgery and have the blue sheet available when she calls to write down important information. If you have any concerns or questions, please feel free to contact our office.     Inguinal Hernia, Adult An inguinal hernia is when fat or your intestines push through a weak spot in a muscle where your leg meets your lower belly (groin). This causes a bulge. This kind of hernia could also be:  In your scrotum, if you are male.  In folds of skin around your vagina, if you are male. There are three types of inguinal hernias:  Hernias that can be pushed back into the belly (are reducible). This type rarely causes pain.  Hernias that cannot be pushed back into the belly (are incarcerated).  Hernias that cannot be pushed back into the belly and lose their blood supply (are strangulated). This type needs emergency surgery. What are the causes? This condition is caused by having a weak spot in the muscles or tissues in your groin. This develops over time. The hernia may poke through the weak spot when you strain your lower belly muscles all of a sudden, such as when you:  Lift a heavy object.  Strain to poop (have a bowel movement). Trouble pooping (constipation) can lead to straining.  Cough. What increases the risk? This condition is more likely to develop in:  Males.  Pregnant females.  People who: ? Are overweight. ? Work in jobs that require long periods of standing or heavy lifting. ? Have had an inguinal hernia before. ? Smoke or have lung disease. These factors can lead to long-term (chronic) coughing. What are the signs or symptoms? Symptoms may depend on the size of the hernia. Often, a small hernia has no symptoms. Symptoms of a larger hernia may include:  A bulge in the groin  area. This is easier to see when standing. You might not be able to see it when you are lying down.  Pain or burning in the groin. This may get worse when you lift, strain, or cough.  A dull ache or a feeling of pressure in the groin.  An abnormal bulge in the scrotum, in males. Symptoms of a strangulated inguinal hernia may include:  A bulge in your groin that is very painful and tender to the touch.  A bulge that turns red or purple.  Fever, feeling like you may vomit (nausea), and vomiting.  Not being able to poop or to pass gas. How is this treated? Treatment depends on the size of your hernia and whether you have symptoms. If you do not have symptoms, your doctor may have you watch your hernia carefully and have you come in for follow-up visits. If your hernia is large or if you have symptoms, you may need surgery to repair the hernia. Follow these instructions at home: Lifestyle  Avoid lifting heavy objects.  Avoid standing for long amounts of time.  Do not smoke or use any products that contain nicotine or tobacco. If you need help quitting, ask your doctor.  Stay at a healthy weight. Prevent trouble pooping You may need to take these actions to prevent or treat trouble pooping:  Drink enough fluid to keep your pee (urine) pale yellow.  Take over-the-counter or prescription medicines.  Eat foods that   are high in fiber. These include beans, whole grains, and fresh fruits and vegetables.  Limit foods that are high in fat and sugar. These include fried or sweet foods. General instructions  You may try to push your hernia back in place by very gently pressing on it when you are lying down. Do not try to push the bulge back in if it will not go in easily.  Watch your hernia for any changes in shape, size, or color. Tell your doctor if you see any changes.  Take over-the-counter and prescription medicines only as told by your doctor.  Keep all follow-up visits. Contact  a doctor if:  You have a fever or chills.  You have new symptoms.  Your symptoms get worse. Get help right away if:  You have pain in your groin that gets worse all of a sudden.  You have a bulge in your groin that: ? Gets bigger all of a sudden, and it does not get smaller after that. ? Turns red or purple. ? Is painful when you touch it.  You are a male, and you have: ? Sudden pain in your scrotum. ? A sudden change in the size of your scrotum.  You cannot push the hernia back in place by very gently pressing on it when you are lying down.  You feel like you may vomit, and that feeling does not go away.  You keep vomiting.  You have a fast heartbeat.  You cannot poop or pass gas. These symptoms may be an emergency. Get help right away. Call your local emergency services (911 in the [place]  Do not wait to see if the symptoms will go away.  Do not drive yourself to the hospital. Summary  An inguinal hernia is when fat or your intestines push through a weak spot in a muscle where your leg meets your lower belly (groin). This causes a bulge.  If you do not have symptoms, you may not need treatment. If you have symptoms or a large hernia, you may need surgery.  Avoid lifting heavy objects. Also, avoid standing for long amounts of time.  Do not try to push the bulge back in if it will not go in easily. This information is not intended to replace advice given to you by your health care provider. Make sure you discuss any questions you have with your health care provider. Document Revised: 10/03/2019 Document Reviewed: 10/03/2019 Elsevier Patient Education  2021 [employer]  

## 2020-05-15 ENCOUNTER — Telehealth: Payer: Self-pay | Admitting: Surgery

## 2020-05-15 NOTE — Telephone Encounter (Signed)
Patient has been advised of Pre-Admission date/time, COVID Testing date and Surgery date.  Surgery Date: 06/03/20 Preadmission Testing Date: 05/23/20 (phone 8a-1p) Covid Testing Date: 05/30/20 in person @ 8:55 am - patient [facility] to [facility] to [facility] ([address] [facility])   Patient has been made aware to call [phone], between 1-3:00pm the day before surgery, to find out what time to arrive for surgery.

## 2020-05-23 ENCOUNTER — Other Ambulatory Visit: Payer: Self-pay

## 2020-05-23 ENCOUNTER — Other Ambulatory Visit
Admission: RE | Admit: 2020-05-23 | Discharge: 2020-05-23 | Disposition: A | Discharge status: Home / Self Care | Payer: BC Managed Care – PPO | Source: Ambulatory Visit | Attending: Surgery | Admitting: Surgery

## 2020-05-23 HISTORY — DX: Unspecified asthma, uncomplicated: J45.909

## 2020-05-23 NOTE — Patient Instructions (Signed)
Your procedure is scheduled on: Monday June 03, 2020. Report to [facility] 2nd [facility] (stop by admissions desk first before getting on the elevator). To find out your arrival time please call [phone] between 1PM - 3PM on Friday May 31, 2020.  Remember: Instructions that are not followed completely may result in serious medical risk,  up to and including death, or upon the discretion of your surgeon and anesthesiologist your  surgery may need to be rescheduled.     _X__ 1. Do not eat food after midnight the night before your procedure.                 No chewing gum or hard candies.   __X__2.  On the morning of surgery brush your teeth with toothpaste and water, you                may rinse your mouth with mouthwash if you wish.  Do not swallow any toothpaste of mouthwash.     _X__ 3.  No Alcohol for 24 hours before or after surgery.   _X__ 4.  Do Not Smoke or use e-cigarettes For 24 Hours Prior to Your Surgery.                 Do not use any chewable tobacco products for at least 6 hours prior to                 Surgery.  _X__  5.  Do not use any recreational drugs (marijuana, cocaine, heroin, ecstasy, MDMA or other)                For at least one week prior to your surgery.  Combination of these drugs with anesthesia                May have life threatening results.   _X___ 6.  Notify your doctor if there is any change in your medical condition      (cold, fever, infections).     Do not wear jewelry, make-up, hairpins, clips or nail polish. Do not wear lotions, powders, or perfumes. You may wear deodorant. Do not shave 48 hours prior to surgery. Men may shave face and neck. Do not bring valuables to the hospital.    [facility] is not responsible for any belongings or valuables.  Contacts, dentures or bridgework may not be worn into surgery. Leave your suitcase in the car. After surgery it may be brought to your room. For  patients admitted to the hospital, discharge time is determined by your treatment team.   Patients discharged the day of surgery will not be allowed to drive home.   Make arrangements for someone to be with you for the first 24 hours of your Same Day Discharge.   __X__ Take these medicines the morning of surgery with A SIP OF WATER:    1. None   2.   3.   4.  5.  6.  ____ Fleet Enema (as directed)   __X__ Use CHG Soap (or wipes) as directed  ____ Use Benzoyl Peroxide Gel as instructed  __X__ Use inhalers on the day of surgery  albuterol (PROVENTIL HFA;VENTOLIN HFA) 108 (90 Base) MCG/ACT inhaler  ____ Stop metformin 2 days prior to surgery    ____ Take 1/2 of usual insulin dose the night before surgery. No insulin the morning          of surgery.   __X__ Stop  Anti-inflammatories such as Ibuprofen, Aleve, Advil, Motrin, naproxen, aspirin, Goody's or BC powders.    __X__ Stop supplements until after surgery.    __X__ Do not start any herbal supplements before your procedure.   If you have any questions regarding your pre-procedure instructions,  Please call Pre-admit Testing at [phone].

## 2020-05-27 ENCOUNTER — Telehealth: Payer: Self-pay | Admitting: *Deleted

## 2020-05-27 ENCOUNTER — Telehealth: Payer: Self-pay | Admitting: Surgery

## 2020-05-27 NOTE — Telephone Encounter (Signed)
Patient has been advised of Pre-Admission date/time, COVID Testing date and Surgery date.  This has also been mailed to him along with his appointment reminder with Dr. Claudine Mouton for June prior to rescheduled surgery.    Surgery Date: 08/07/20 Preadmission Testing Date: 07/29/20 (phone 8a-1p) Covid Testing Date: 08/05/20 - patient advised to [facility] to [facility] ([address] [facility])   Patient has been made aware to call [phone], between 1-3:00pm the day before surgery, to find out what time to arrive for surgery.

## 2020-05-27 NOTE — Telephone Encounter (Signed)
Patient called and stated that he wants to reschedule his surgery that is scheduled for 06/03/20 Dr Claudine Mouton.

## 2020-05-30 ENCOUNTER — Other Ambulatory Visit: Payer: BC Managed Care – PPO

## 2020-07-29 ENCOUNTER — Telehealth: Payer: Self-pay | Admitting: Surgery

## 2020-07-29 ENCOUNTER — Inpatient Hospital Stay: Admission: RE | Admit: 2020-07-29 | Payer: BC Managed Care – PPO | Source: Ambulatory Visit

## 2020-07-29 NOTE — Telephone Encounter (Signed)
Patient calls.  He has cancelled his surgery again for 08/07/20.  Patient states that work is much too hectic and can't take any time off.  He will perhaps call and reschedule follow up in November.

## 2020-08-01 ENCOUNTER — Ambulatory Visit: Payer: BC Managed Care – PPO | Admitting: Surgery

## 2020-08-05 ENCOUNTER — Other Ambulatory Visit: Admission: RE | Admit: 2020-08-05 | Payer: BC Managed Care – PPO | Source: Ambulatory Visit

## 2020-08-07 ENCOUNTER — Ambulatory Visit: Admission: RE | Admit: 2020-08-07 | Payer: Self-pay | Source: Home / Self Care | Admitting: Surgery

## 2020-08-07 ENCOUNTER — Encounter: Admission: RE | Payer: Self-pay | Source: Home / Self Care

## 2020-08-07 SURGERY — REPAIR, HERNIA, INGUINAL, ROBOT-ASSISTED, LAPAROSCOPIC, USING MESH
Anesthesia: General | Laterality: Bilateral

## 2020-11-06 IMAGING — DX DG RIBS W/ CHEST 3+V*R*
3 series · 3 of 3 positions shown · non-contrast
Comparison: 12/28/2016

CLINICAL DATA: Fall from ladder 5 days ago with persistent
right-sided chest pain, initial encounter

EXAM:
RIGHT RIBS AND CHEST - 3+ VIEW

[chest pa]
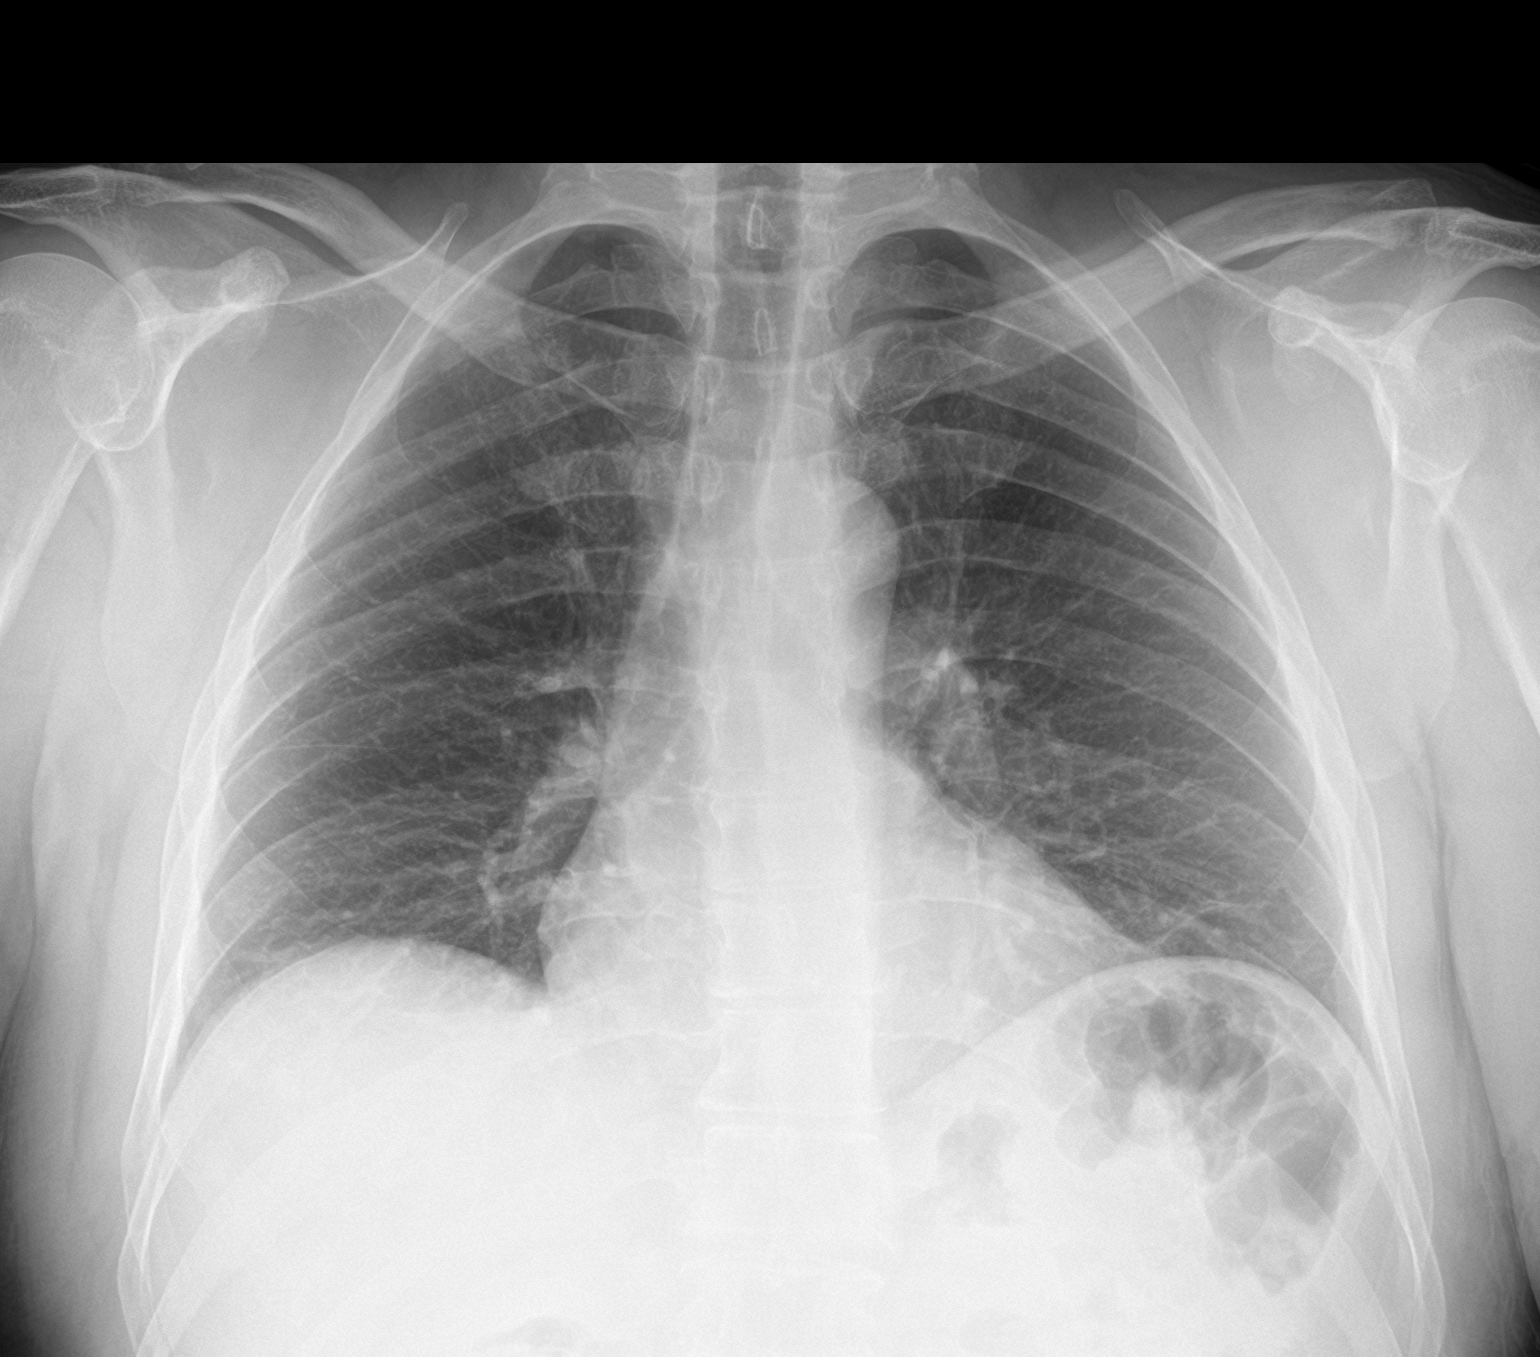

[rib pa]
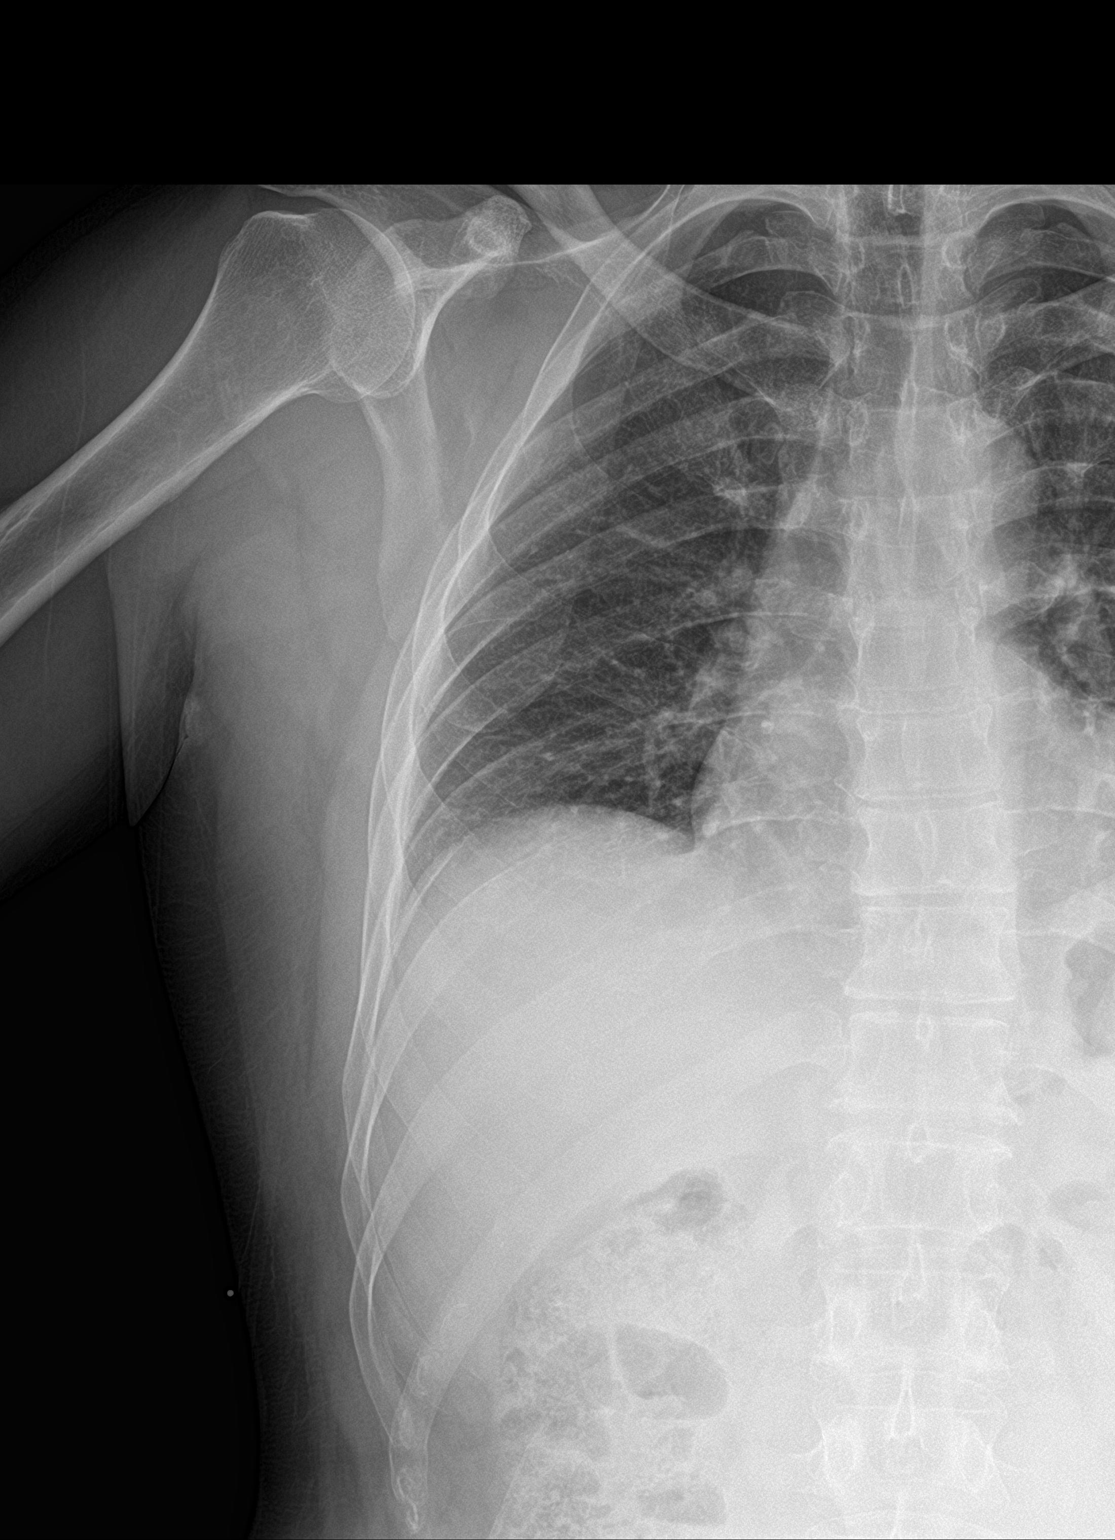

[rib obl]
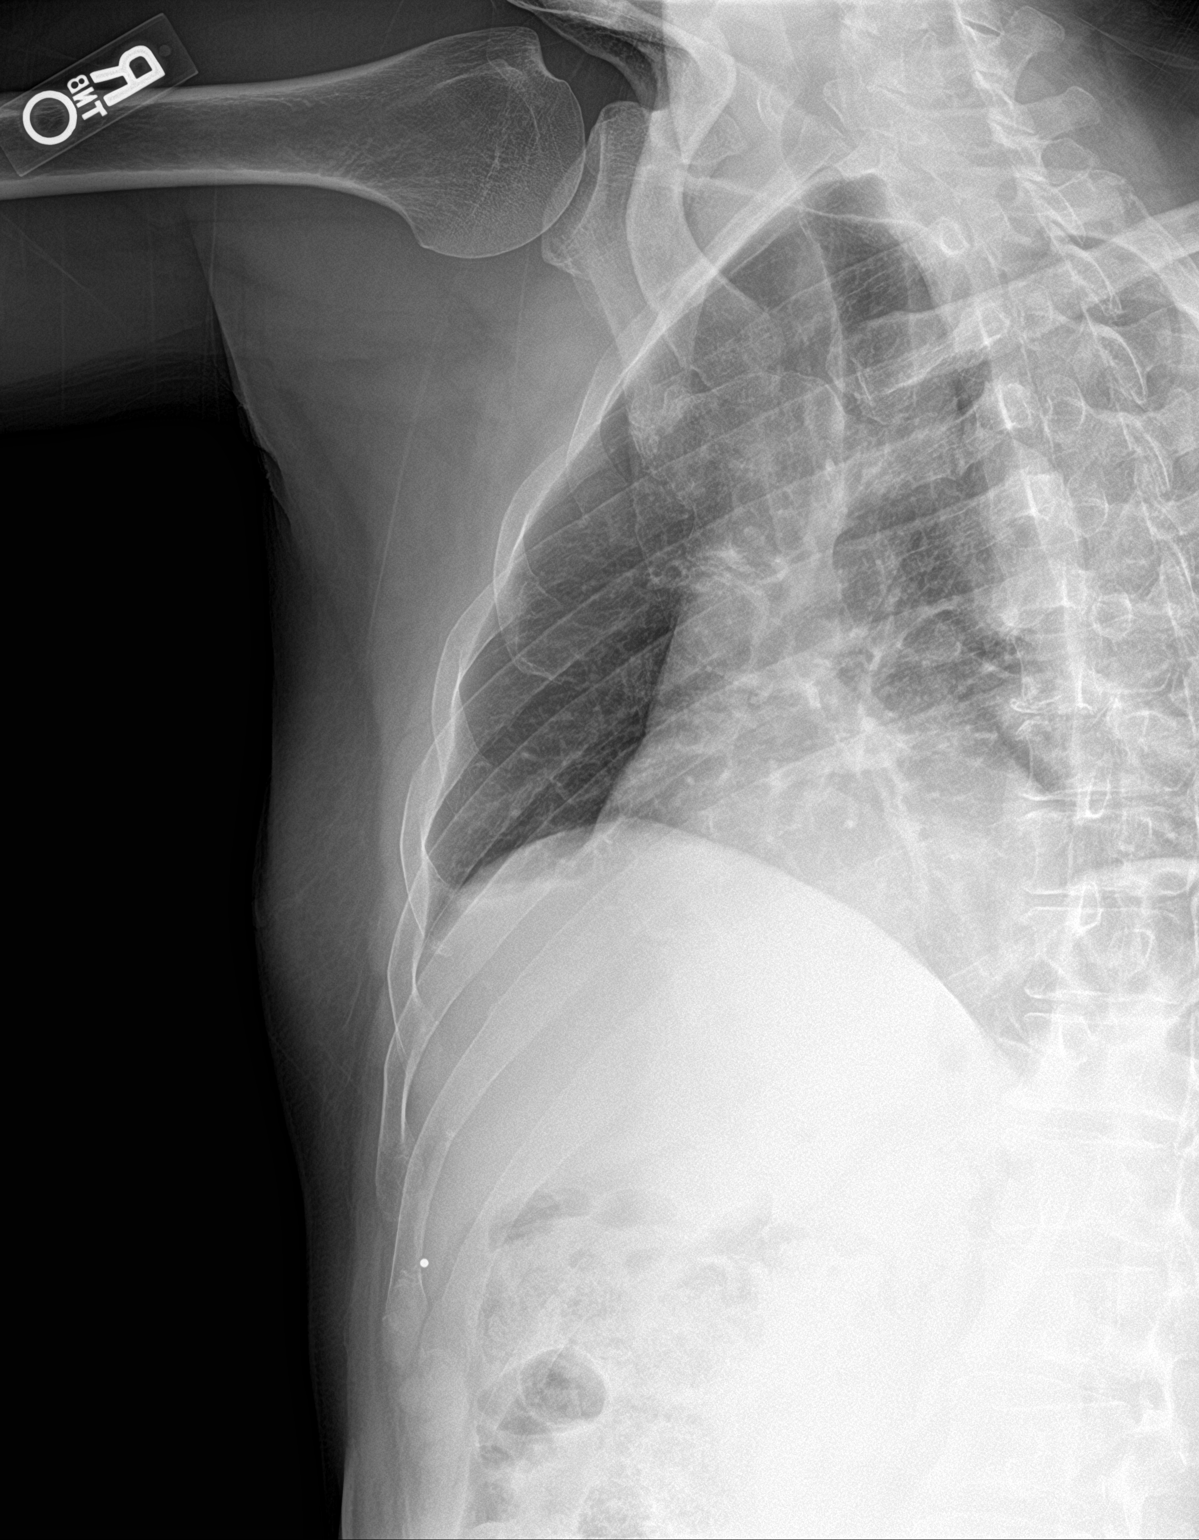

[3 of 3 positions shown; findings below may reference images not displayed]

FINDINGS: Cardiac shadow is within normal limits. The lungs are well aerated
bilaterally. No definitive pneumothorax is seen. Undisplaced right
ninth rib fracture is noted anterior laterally. No other fractures
are seen.
IMPRESSION: Undisplaced right ninth rib fracture without complicating factors.

## 2020-11-28 ENCOUNTER — Ambulatory Visit (INDEPENDENT_AMBULATORY_CARE_PROVIDER_SITE_OTHER): Payer: Self-pay | Admitting: Surgery

## 2020-11-28 ENCOUNTER — Encounter: Payer: Self-pay | Admitting: Surgery

## 2020-11-28 ENCOUNTER — Other Ambulatory Visit: Payer: Self-pay

## 2020-11-28 VITALS — BP 148/89 | HR 92 | Temp 98.2°F | Ht 67.0 in | Wt 209.2 lb

## 2020-11-28 DIAGNOSIS — K4020 Bilateral inguinal hernia, without obstruction or gangrene, not specified as recurrent: Secondary | ICD-10-CM

## 2020-11-28 DIAGNOSIS — Z302 Encounter for sterilization: Secondary | ICD-10-CM

## 2020-11-28 NOTE — Patient Instructions (Addendum)
If you have any concerns or questions, please feel free to call our office. See follow up appointment below. 

## 2020-11-28 NOTE — Progress Notes (Signed)
This patient is well-known to me, with previously diagnosed bilateral inguinal hernias.  He is also interested in getting a vasectomy.  He would like to accomplish both simultaneously, I believe this can be accomplished with the precautions we discussed.  He has reestablishment of his insurance, but due to his current workload would like to defer proceeding with surgery until January.  Therefore we will anticipate a revisit mid late December.

## 2021-02-06 ENCOUNTER — Encounter: Payer: Self-pay | Admitting: Surgery

## 2021-02-06 ENCOUNTER — Other Ambulatory Visit: Payer: Self-pay

## 2021-02-06 ENCOUNTER — Ambulatory Visit (INDEPENDENT_AMBULATORY_CARE_PROVIDER_SITE_OTHER): Payer: BC Managed Care – PPO | Admitting: Surgery

## 2021-02-06 ENCOUNTER — Ambulatory Visit: Payer: Self-pay | Admitting: Surgery

## 2021-02-06 ENCOUNTER — Telehealth: Payer: Self-pay | Admitting: Surgery

## 2021-02-06 VITALS — BP 129/82 | HR 83 | Temp 98.0°F | Ht 66.0 in | Wt 210.6 lb

## 2021-02-06 DIAGNOSIS — Z302 Encounter for sterilization: Secondary | ICD-10-CM

## 2021-02-06 DIAGNOSIS — K4020 Bilateral inguinal hernia, without obstruction or gangrene, not specified as recurrent: Secondary | ICD-10-CM

## 2021-02-06 NOTE — Telephone Encounter (Signed)
Outgoing call is made, left message for patient to call.  Please inform of the following regarding scheduled surgery:  Pre-Admission date/time, COVID Testing date and Surgery date.  Surgery Date: 02/26/21 Preadmission Testing Date: 02/19/21 (phone 8a-1p) Covid Testing Date: Not needed.     Also patient will need to call at [phone], between 1-3:00pm the day before surgery, to find out what time to arrive for surgery.    Also please confirm health insurance for 2023

## 2021-02-06 NOTE — Progress Notes (Signed)
Patient ID: Jeffery Matusek., male   DOB: [birth year], 45 y.o.   MRN: [id number]  Chief Complaint: Right sided hernia  History of Present Illness Jeffery Carlson. is a 45 y.o. male with complaints of a right sided inguinal hernia for 5 to 6 years.  Reports over the last year it has begun to bulge out more and become more pop problematic.  It appears to get worse with prolonged standing, denying any exacerbations with lifting, coughing or sneezing.  He reports sitting and resting alleviates the pain.  Denies any history of nausea, vomiting, fevers or chills.  He reports at its worst the pain as a 10.  He denies any difficulty voiding.  Denies any history of constipation.  Past Medical History Past Medical History:  Diagnosis Date   Asthma    Bronchitis    History of umbilical hernia repair with mesh 05/14/2020      Past Surgical History:  Procedure Laterality Date   ABDOMINAL SURGERY  2010   umbilical hernia     No Known Allergies  Current Outpatient Medications  Medication Sig Dispense Refill   albuterol (PROVENTIL HFA;VENTOLIN HFA) 108 (90 Base) MCG/ACT inhaler Inhale 2 puffs every 6 (six) hours as needed into the lungs for wheezing or shortness of breath. 1 Inhaler 2   methocarbamol (ROBAXIN) 500 MG tablet Take 1 tablet (500 mg total) by mouth 4 (four) times daily. (Patient taking differently: Take 500 mg by mouth every 6 (six) hours as needed for muscle spasms.) 12 tablet 0   montelukast (SINGULAIR) 10 MG tablet Take 10 mg by mouth daily.     naproxen (NAPROSYN) 500 MG tablet Take 500 mg by mouth 2 (two) times daily as needed.     No current facility-administered medications for this visit.    Family History Family History  Problem Relation Age of Onset   Heart failure Mother    Diabetes Father       Social History Social History   Tobacco Use   Smoking status: Never   Smokeless tobacco: Never  Vaping Use   Vaping Use: Never used  Substance Use Topics    Alcohol use: Yes    Comment: occasional    Drug use: No        Review of Systems  Constitutional: Negative.   HENT: Negative.    Eyes: Negative.   Respiratory: Negative.    Cardiovascular: Negative.   Gastrointestinal: Negative.   Genitourinary: Negative.   Skin: Negative.   Neurological: Negative.   Psychiatric/Behavioral: Negative.       Physical Exam Blood pressure 129/82, pulse 83, temperature 98 F (36.7 C), height 5\' 6"  (1.676 m), weight 210 lb 9.6 oz (95.5 kg), SpO2 98 %. Last Weight  Most recent update: 02/06/2021  8:50 AM    Weight  95.5 kg (210 lb 9.6 oz)             CONSTITUTIONAL: Well developed, and nourished, appropriately responsive and aware without distress.   EYES: Sclera non-icteric.   EARS, NOSE, MOUTH AND THROAT:  No appreciable oropharyngeal lesions.   Hearing is intact to voice.  NECK: Trachea is midline, and there is no jugular venous distension.  LYMPH NODES:  Lymph nodes in the neck are not enlarged. RESPIRATORY:  Lungs are clear, and breath sounds are equal bilaterally. Normal respiratory effort without pathologic use of accessory muscles. CARDIOVASCULAR: Heart is regular in rate and rhythm. GI: The abdomen is soft, nontender, and nondistended.  Umbilical scar  from prior umbilical hernia repair.  There were no palpable masses. I did not appreciate hepatosplenomegaly. There were normal bowel sounds. GU: Readily palpable right sided inguinal hernia, left-sided inguinal hernia also appreciated. MUSCULOSKELETAL:  Symmetrical muscle tone appreciated in all four extremities.    SKIN: Skin turgor is normal. No pathologic skin lesions appreciated.  NEUROLOGIC:  Motor and sensation appear grossly normal.  Cranial nerves are grossly without defect. PSYCH:  Alert and oriented to person, place and time. Affect is appropriate for situation.  Data Reviewed I have personally reviewed what is currently available of the patient's imaging, recent labs and  medical records.   Labs:  CBC Latest Ref Rng & Units 12/27/2016 04/20/2016 02/20/2013  WBC 3.8 - 10.6 K/uL 6.4 7.9 9.0  Hemoglobin 13.0 - 18.0 g/dL 14.5 15.0 14.0  Hematocrit 40.0 - 52.0 % 42.5 42.9 39.3(L)  Platelets 150 - 440 K/uL 212 213 253   CMP Latest Ref Rng & Units 12/27/2016 04/20/2016 02/20/2013  Glucose 65 - 99 mg/dL 93 79 98  BUN 6 - 20 mg/dL 14 13 12   Creatinine 0.61 - 1.24 mg/dL 1.00 0.89 0.86  Sodium 135 - 145 mmol/L 138 139 137  Potassium 3.5 - 5.1 mmol/L 3.7 3.6 3.3(L)  Chloride 101 - 111 mmol/L 104 107 106  CO2 22 - 32 mmol/L 25 28 25   Calcium 8.9 - 10.3 mg/dL 8.9 9.2 8.7      Imaging:  Within last 24 hrs: No results found.  Assessment     Patient Active Problem List   Diagnosis Date Noted   Request for sterilization 11/28/2020   History of umbilical hernia repair with mesh 05/14/2020   Non-recurrent bilateral inguinal hernia without obstruction or gangrene 05/14/2020   Intermittent asthma without complication [id number]    Plan    Robotic bilateral inguinal hernia repairs. Vasectomy for sterilization. I discussed possibility of incarceration, strangulation, enlargement in size over time, and the need for emergency surgery in the face of these.  Also reviewed the techniques of reduction should incarceration occur, and when unsuccessful to present to the ED.  Also discussed that surgery risks include recurrence which can be up to 30% in the case of complex hernias, use of prosthetic materials (mesh) and the increased risk of infection and the possible need for re-operation and removal of mesh, possibility of post-op SBO or ileus, and the risks of general anesthetic including heart attack, stroke, sudden death or some reaction to anesthetic medications. The patient, and those present, appear to understand the risks, any and all questions were answered to the patient's satisfaction.  No guarantees were ever expressed or implied. We discussed the irreversible nature of  retroperitoneal vasectomy, the need for protected intercourse to follow awaiting confirmation of sterilization.  I believe he understands and desires to proceed.  Face-to-face time spent with the patient and accompanying care providers(if present) was 15 minutes, with more than 50% of the time spent counseling, educating, and coordinating care of the patient.      Ronny Bacon M.D., FACS 02/06/2021, 9:14 AM

## 2021-02-06 NOTE — Telephone Encounter (Signed)
Spoke with the patient and let him know his surgery information. He states that his insurance will remain the same for 2023.

## 2021-02-06 NOTE — Patient Instructions (Signed)
You have chose to have your hernia repaired. This will be done by Dr. Claudine Mouton at [facility].  Please see your (blue) Pre-care information that you have been given today.  You will need to arrange to be out of work for 2 weeks and then return with a lifting restrictions for 4 more weeks. Please send any FMLA paperwork prior to surgery and we will fill this out and fax it back to your employer within 3 business days.  You may have a bruise in your groin and also swelling and brusing in your testicle area. You may use ice 4-5 times daily for 15-20 minutes each time. Make sure that you place a barrier between you and the ice pack. To decrease the swelling, you may roll up a bath towel and place it vertically in between your thighs with your testicles resting on the towel. You will want to keep this area elevated as much as possible for several days following surgery.    Inguinal Hernia, Adult Muscles help keep everything in the body in its proper place. But if a weak spot in the muscles develops, something can poke through. That is called a hernia. When this happens in the lower part of the belly (abdomen), it is called an inguinal hernia. (It takes its name from a part of the body in this region called the inguinal canal.) A weak spot in the wall of muscles lets some fat or part of the small intestine bulge through. An inguinal hernia can develop at any age. Men get them more often than women. CAUSES  In adults, an inguinal hernia develops over time. It can be triggered by: Suddenly straining the muscles of the lower abdomen. Lifting heavy objects. Straining to have a bowel movement. Difficult bowel movements (constipation) can lead to this. Constant coughing. This may be caused by smoking or lung disease. Being overweight. Being pregnant. Working at a job that requires long periods of standing or heavy lifting. Having had an inguinal hernia before. One type can be an emergency situation. It is  called a strangulated inguinal hernia. It develops if part of the small intestine slips through the weak spot and cannot get back into the abdomen. The blood supply can be cut off. If that happens, part of the intestine may die. This situation requires emergency surgery. SYMPTOMS  Often, a small inguinal hernia has no symptoms. It is found when a healthcare provider does a physical exam. Larger hernias usually have symptoms.  In adults, symptoms may include: A lump in the groin. This is easier to see when the person is standing. It might disappear when lying down. In men, a lump in the scrotum. Pain or burning in the groin. This occurs especially when lifting, straining or coughing. A dull ache or feeling of pressure in the groin. Signs of a strangulated hernia can include: A bulge in the groin that becomes very painful and tender to the touch. A bulge that turns red or purple. Fever, nausea and vomiting. Inability to have a bowel movement or to pass gas. DIAGNOSIS  To decide if you have an inguinal hernia, a healthcare provider will probably do a physical examination. This will include asking questions about any symptoms you have noticed. The healthcare provider might feel the groin area and ask you to cough. If an inguinal hernia is felt, the healthcare provider may try to slide it back into the abdomen. Usually no other tests are needed. TREATMENT  Treatments can vary. The size of  the hernia makes a difference. Options include: Watchful waiting. This is often suggested if the hernia is small and you have had no symptoms. No medical procedure will be done unless symptoms develop. You will need to watch closely for symptoms. If any occur, contact your healthcare provider right away. Surgery. This is used if the hernia is larger or you have symptoms. Open surgery. This is usually an outpatient procedure (you will not stay overnight in a hospital). An cut (incision) is made through the skin in  the groin. The hernia is put back inside the abdomen. The weak area in the muscles is then repaired by herniorrhaphy or hernioplasty. Herniorrhaphy: in this type of surgery, the weak muscles are sewn back together. Hernioplasty: a patch or mesh is used to close the weak area in the abdominal wall. Laparoscopy. In this procedure, a surgeon makes small incisions. A thin tube with a tiny video camera (called a laparoscope) is put into the abdomen. The surgeon repairs the hernia with mesh by looking with the video camera and using two long instruments. HOME CARE INSTRUCTIONS  After surgery to repair an inguinal hernia: You will need to take pain medicine prescribed by your healthcare provider. Follow all directions carefully. You will need to take care of the wound from the incision. Your activity will be restricted for awhile. This will probably include no heavy lifting for several weeks. You also should not do anything too active for a few weeks. When you can return to work will depend on the type of job that you have. During "watchful waiting" periods, you should: Maintain a healthy weight. Eat a diet high in fiber (fruits, vegetables and whole grains). Drink plenty of fluids to avoid constipation. This means drinking enough water and other liquids to keep your urine clear or pale yellow. Do not lift heavy objects. Do not stand for long periods of time. Quit smoking. This should keep you from developing a frequent cough. SEEK MEDICAL CARE IF:  A bulge develops in your groin area. You feel pain, a burning sensation or pressure in the groin. This might be worse if you are lifting or straining. You develop a fever of more than 100.5 F (38.1 C). SEEK IMMEDIATE MEDICAL CARE IF:  Pain in the groin increases suddenly. A bulge in the groin gets bigger suddenly and does not go down. For men, there is sudden pain in the scrotum. Or, the size of the scrotum increases. A bulge in the groin area becomes  red or purple and is painful to touch. You have nausea or vomiting that does not go away. You feel your heart beating much faster than normal. You cannot have a bowel movement or pass gas. You develop a fever of more than 102.0 F (38.9 C).   This information is not intended to replace advice given to you by your health care provider. Make sure you discuss any questions you have with your health care provider.   Document Released: 06/21/2008 Document Revised: 04/27/2011 Document Reviewed: 08/06/2014 Elsevier Interactive Patient Education 2016 [employer].  Vasectomy Vasectomy is a procedure in which the vas deferens is cut and then tied or burned (cauterized). The vas deferens is a tube that carries sperm from the testicle to the part of the body that drains urine from the bladder (urethra). This procedure blocks sperm from going through the vas deferens and penis during ejaculation. This ensures that sperm does not go into the vagina during sex. Vasectomy does not affect sexual  desire or performance and does not prevent sexually transmitted infections. Vasectomy is considered a permanent and very effective form of birth control (contraception). The decision to have a vasectomy should not be made during a stressful time, such as after the loss of a pregnancy or a divorce. You and your partner should decide on whether to have a vasectomy when you are sure that you do not want children in the future. Tell a health care provider about: Any allergies you have. All medicines you are taking, including vitamins, herbs, eye drops, creams, and over-the-counter medicines. Any problems you or family members have had with anesthetic medicines. Any blood disorders you have. Any surgeries you have had. Any medical conditions you have. What are the risks? Generally, this is a safe procedure. However, problems may occur, including: Infection. Bleeding and swelling of the scrotum. The scrotum is the sac  that contains the testicles, blood vessels, and structures that help deliver sperm and semen. Allergic reactions to medicines. Failure of the procedure to prevent pregnancy. There is a very small chance that the tied or cauterized ends of the vas deferens may reconnect (recanalization). If this happens, you could still make a woman pregnant. Pain in the scrotum that continues after you heal from the procedure. What happens before the procedure? Medicines Ask your health care provider about: Changing or stopping your regular medicines. This is especially important if you are taking diabetes medicines or blood thinners. Taking medicines such as aspirin and ibuprofen. These medicines can thin your blood. Do not take these medicines unless your health care provider tells you to take them. Taking over-the-counter medicines, vitamins, herbs, and supplements. You may be told to take a medicine to help you relax (sedative) a few hours before the procedure. General instructions Do not use any products that contain nicotine or tobacco for at least 4 weeks before the procedure. These products include cigarettes, e-cigarettes, and chewing tobacco. If you need help quitting, ask your health care provider. Plan to have a responsible adult take you home from the hospital or clinic. If you will be going home right after the procedure, plan to have a responsible adult care for you for the time you are told. This is important. Ask your health care provider: How your surgery site will be marked. What steps will be taken to help prevent infection. These steps may include: Removing hair at the surgery site. Washing skin with a germ-killing soap. Taking antibiotic medicine. What happens during the procedure?  You will be given one or more of the following: A sedative, unless you were told to take this a few hours before the procedure. A medicine to numb the area (local anesthetic). Your health care provider will  feel, or palpate, for your vas deferens. To reach the vas deferens, one of two methods may be used: A very small incision may be made in your scrotum. A punctured opening may be made in your scrotum, without an incision. Your vas deferens will be pulled out of your scrotum and cut. Then, the vas deferens will be closed in one of two ways: Tied at the ends. Cauterized at the ends to seal them off. The vas deferens will be put back into your scrotum. The incision or puncture opening will be closed with absorbable stitches (sutures). The sutures will eventually dissolve and will not need to be removed after the procedure. The procedure will be repeated on the other side of your scrotum. The procedure may vary among health care  providers and hospitals. What happens after the procedure? You will be monitored to make sure that you do not have problems. You will be asked not to ejaculate for at least 1 week after the procedure, or for as long as you are told. You will need to use a different form of contraception for 2-4 months after the procedure, until you have test results confirming that there are no sperm in your semen. You may be given scrotal support to wear, such as a jockstrap or underwear with a supportive pouch. If you were given a sedative during the procedure, it can affect you for several hours. Do not drive or operate machinery until your health care provider says that it is safe. Summary Vasectomy blocks sperm from being released during ejaculation. This procedure is considered a permanent and very effective form of birth control. Your scrotum will be numbed with medicine (local anesthetic) for the procedure. After the procedure, you will be asked not to ejaculate for at least 1 week, or for as long as you are told. You will also need to use a different form of contraception until your test results confirm that there are no sperm in your semen. This information is not intended to  replace advice given to you by your health care provider. Make sure you discuss any questions you have with your health care provider. Document Revised: 06/22/2019 Document Reviewed: 06/22/2019 Elsevier Patient Education  2022 [employer].

## 2021-02-06 NOTE — H&P (View-Only) (Signed)
Patient ID: Jeffery Carlson., male   DOB: [birth year], 45 y.o.   MRN: [id number]  Chief Complaint: Right sided hernia  History of Present Illness [name] is a 45 y.o. male with complaints of a right sided inguinal hernia for 5 to 6 years.  Reports over the last year it has begun to bulge out more and become more pop problematic.  It appears to get worse with prolonged standing, denying any exacerbations with lifting, coughing or sneezing.  He reports sitting and resting alleviates the pain.  Denies any history of nausea, vomiting, fevers or chills.  He reports at its worst the pain as a 10.  He denies any difficulty voiding.  Denies any history of constipation.  Past Medical History Past Medical History:  Diagnosis Date   Asthma    Bronchitis    History of umbilical hernia repair with mesh 05/14/2020      Past Surgical History:  Procedure Laterality Date   ABDOMINAL SURGERY  2010   umbilical hernia     No Known Allergies  Current Outpatient Medications  Medication Sig Dispense Refill   albuterol (PROVENTIL HFA;VENTOLIN HFA) 108 (90 Base) MCG/ACT inhaler Inhale 2 puffs every 6 (six) hours as needed into the lungs for wheezing or shortness of breath. 1 Inhaler 2   methocarbamol (ROBAXIN) 500 MG tablet Take 1 tablet (500 mg total) by mouth 4 (four) times daily. (Patient taking differently: Take 500 mg by mouth every 6 (six) hours as needed for muscle spasms.) 12 tablet 0   montelukast (SINGULAIR) 10 MG tablet Take 10 mg by mouth daily.     naproxen (NAPROSYN) 500 MG tablet Take 500 mg by mouth 2 (two) times daily as needed.     No current facility-administered medications for this visit.    Family History Family History  Problem Relation Age of Onset   Heart failure Mother    Diabetes Father       Social History Social History   Tobacco Use   Smoking status: Never   Smokeless tobacco: Never  Vaping Use   Vaping Use: Never used  Substance Use Topics    Alcohol use: Yes    Comment: occasional    Drug use: No        Review of Systems  Constitutional: Negative.   HENT: Negative.    Eyes: Negative.   Respiratory: Negative.    Cardiovascular: Negative.   Gastrointestinal: Negative.   Genitourinary: Negative.   Skin: Negative.   Neurological: Negative.   Psychiatric/Behavioral: Negative.       Physical Exam Blood pressure 129/82, pulse 83, temperature 98 F (36.7 C), height 5\' 6"  (1.676 m), weight 210 lb 9.6 oz (95.5 kg), SpO2 98 %. Last Weight  Most recent update: 02/06/2021  8:50 AM    Weight  95.5 kg (210 lb 9.6 oz)             CONSTITUTIONAL: Well developed, and nourished, appropriately responsive and aware without distress.   EYES: Sclera non-icteric.   EARS, NOSE, MOUTH AND THROAT:  No appreciable oropharyngeal lesions.   Hearing is intact to voice.  NECK: Trachea is midline, and there is no jugular venous distension.  LYMPH NODES:  Lymph nodes in the neck are not enlarged. RESPIRATORY:  Lungs are clear, and breath sounds are equal bilaterally. Normal respiratory effort without pathologic use of accessory muscles. CARDIOVASCULAR: Heart is regular in rate and rhythm. GI: The abdomen is soft, nontender, and nondistended.  Umbilical scar  from prior umbilical hernia repair.  There were no palpable masses. I did not appreciate hepatosplenomegaly. There were normal bowel sounds. GU: Readily palpable right sided inguinal hernia, left-sided inguinal hernia also appreciated. MUSCULOSKELETAL:  Symmetrical muscle tone appreciated in all four extremities.    SKIN: Skin turgor is normal. No pathologic skin lesions appreciated.  NEUROLOGIC:  Motor and sensation appear grossly normal.  Cranial nerves are grossly without defect. PSYCH:  Alert and oriented to person, place and time. Affect is appropriate for situation.  Data Reviewed I have personally reviewed what is currently available of the patient's imaging, recent labs and  medical records.   Labs:  CBC Latest Ref Rng & Units 12/27/2016 04/20/2016 02/20/2013  WBC 3.8 - 10.6 K/uL 6.4 7.9 9.0  Hemoglobin 13.0 - 18.0 g/dL 14.5 15.0 14.0  Hematocrit 40.0 - 52.0 % 42.5 42.9 39.3(L)  Platelets 150 - 440 K/uL 212 213 253   CMP Latest Ref Rng & Units 12/27/2016 04/20/2016 02/20/2013  Glucose 65 - 99 mg/dL 93 79 98  BUN 6 - 20 mg/dL 14 13 12   Creatinine 0.61 - 1.24 mg/dL 1.00 0.89 0.86  Sodium 135 - 145 mmol/L 138 139 137  Potassium 3.5 - 5.1 mmol/L 3.7 3.6 3.3(L)  Chloride 101 - 111 mmol/L 104 107 106  CO2 22 - 32 mmol/L 25 28 25   Calcium 8.9 - 10.3 mg/dL 8.9 9.2 8.7      Imaging:  Within last 24 hrs: No results found.  Assessment     Patient Active Problem List   Diagnosis Date Noted   Request for sterilization 11/28/2020   History of umbilical hernia repair with mesh 05/14/2020   Non-recurrent bilateral inguinal hernia without obstruction or gangrene 05/14/2020   Intermittent asthma without complication [id number]    Plan    Robotic bilateral inguinal hernia repairs. Vasectomy for sterilization. I discussed possibility of incarceration, strangulation, enlargement in size over time, and the need for emergency surgery in the face of these.  Also reviewed the techniques of reduction should incarceration occur, and when unsuccessful to present to the ED.  Also discussed that surgery risks include recurrence which can be up to 30% in the case of complex hernias, use of prosthetic materials (mesh) and the increased risk of infection and the possible need for re-operation and removal of mesh, possibility of post-op SBO or ileus, and the risks of general anesthetic including heart attack, stroke, sudden death or some reaction to anesthetic medications. The patient, and those present, appear to understand the risks, any and all questions were answered to the patient's satisfaction.  No guarantees were ever expressed or implied. We discussed the irreversible nature of  retroperitoneal vasectomy, the need for protected intercourse to follow awaiting confirmation of sterilization.  I believe he understands and desires to proceed.  Face-to-face time spent with the patient and accompanying care providers(if present) was 15 minutes, with more than 50% of the time spent counseling, educating, and coordinating care of the patient.      Ronny Bacon M.D., FACS 02/06/2021, 9:14 AM

## 2021-02-19 ENCOUNTER — Other Ambulatory Visit: Payer: Self-pay

## 2021-02-19 ENCOUNTER — Encounter: Payer: Self-pay | Admitting: Urgent Care

## 2021-02-19 ENCOUNTER — Encounter
Admission: RE | Admit: 2021-02-19 | Discharge: 2021-02-19 | Disposition: A | Discharge status: Home / Self Care | Payer: BC Managed Care – PPO | Source: Ambulatory Visit | Attending: Surgery | Admitting: Surgery

## 2021-02-19 DIAGNOSIS — K4020 Bilateral inguinal hernia, without obstruction or gangrene, not specified as recurrent: Secondary | ICD-10-CM | POA: Insufficient documentation

## 2021-02-19 DIAGNOSIS — Z01812 Encounter for preprocedural laboratory examination: Secondary | ICD-10-CM | POA: Diagnosis present

## 2021-02-19 DIAGNOSIS — Z302 Encounter for sterilization: Secondary | ICD-10-CM

## 2021-02-19 LAB — COMPREHENSIVE METABOLIC PANEL
ALT: 46 U/L — ABNORMAL HIGH (ref 0–44)
AST: 28 U/L (ref 15–41)
Albumin: 3.9 g/dL (ref 3.5–5.0)
Alkaline Phosphatase: 62 U/L (ref 38–126)
Anion gap: 7 (ref 5–15)
BUN: 15 mg/dL (ref 6–20)
CO2: 26 mmol/L (ref 22–32)
Calcium: 9.1 mg/dL (ref 8.9–10.3)
Chloride: 105 mmol/L (ref 98–111)
Creatinine, Ser: 0.96 mg/dL (ref 0.61–1.24)
GFR, Estimated: >60 mL/min (ref >60)
Glucose, Bld: 80 mg/dL (ref 70–99)
Potassium: 3.9 mmol/L (ref 3.5–5.1)
Sodium: 138 mmol/L (ref 135–145)
Total Bilirubin: 0.8 mg/dL (ref 0.3–1.2)
Total Protein: 7.2 g/dL (ref 6.5–8.1)

## 2021-02-19 LAB — CBC WITH DIFFERENTIAL/PLATELET
Abs Immature Granulocytes: 0.03 10*3/uL (ref 0.00–0.07)
Basophils Absolute: 0 10*3/uL (ref 0.0–0.1)
Basophils Relative: 1 %
Eosinophils Absolute: 0.3 10*3/uL (ref 0.0–0.5)
Eosinophils Relative: 4 %
HCT: 42.1 % (ref 39.0–52.0)
Hemoglobin: 14.4 g/dL (ref 13.0–17.0)
Immature Granulocytes: 0 %
Lymphocytes Relative: 23 %
Lymphs Abs: 1.7 10*3/uL (ref 0.7–4.0)
MCH: 28.9 pg (ref 26.0–34.0)
MCHC: 34.2 g/dL (ref 30.0–36.0)
MCV: 84.5 fL (ref 80.0–100.0)
Monocytes Absolute: 0.7 10*3/uL (ref 0.1–1.0)
Monocytes Relative: 9 %
Neutro Abs: 4.7 10*3/uL (ref 1.7–7.7)
Neutrophils Relative %: 63 %
Platelets: 245 10*3/uL (ref 150–400)
RBC: 4.98 MIL/uL (ref 4.22–5.81)
RDW: 12.1 % (ref 11.5–15.5)
WBC: 7.5 10*3/uL (ref 4.0–10.5)
nRBC: 0 % (ref 0.0–0.2)

## 2021-02-19 NOTE — Patient Instructions (Addendum)
Your procedure is scheduled on: Wednesday, January 11 Report to the Registration Desk on the 1st floor of the [facility] To find out your arrival time, please call [phone] between 1PM - 3PM on: Tuesday, January 10  REMEMBER: Instructions that are not followed completely may result in serious medical risk, up to and including death; or upon the discretion of your surgeon and anesthesiologist your surgery may need to be rescheduled.  Do not eat food after midnight the night before surgery.  No gum chewing, lozengers or hard candies.  You may however, drink CLEAR liquids up to 2 hours before you are scheduled to arrive for your surgery. Do not drink anything within 2 hours of your scheduled arrival time.  Clear liquids include: - water  - apple juice without pulp - gatorade (not RED, PURPLE, OR BLUE) - black coffee or tea (Do NOT add milk or creamers to the coffee or tea) Do NOT drink anything that is not on this list.  TAKE THESE MEDICATIONS THE MORNING OF SURGERY WITH A SIP OF WATER:  Albuterol inhaler  Use inhalers on the day of surgery and bring to the hospital.  One week prior to surgery: starting January 4 Stop Anti-inflammatories (NSAIDS) such as Advil, Aleve, Ibuprofen, Motrin, Naproxen, Naprosyn and Aspirin based products such as Excedrin, Goodys Powder, BC Powder. Stop ANY OVER THE COUNTER supplements until after surgery. You may however, continue to take Tylenol if needed for pain up until the day of surgery.  No Alcohol for 24 hours before or after surgery.  No Smoking including e-cigarettes for 24 hours prior to surgery.  No chewable tobacco products for at least 6 hours prior to surgery.  No nicotine patches on the day of surgery.  Do not use any "recreational" drugs for at least a week prior to your surgery.  Please be advised that the combination of cocaine and anesthesia may have negative outcomes, up to and including death. If you test positive for  cocaine, your surgery will be cancelled.  On the morning of surgery brush your teeth with toothpaste and water, you may rinse your mouth with mouthwash if you wish. Do not swallow any toothpaste or mouthwash.  Use CHG Soap as directed on instruction sheet.  Do not wear jewelry, make-up, hairpins, clips or nail polish.  Do not wear lotions, powders, or perfumes.   Do not shave body from the neck down 48 hours prior to surgery just in case you cut yourself which could leave a site for infection.  Also, freshly shaved skin may become irritated if using the CHG soap.  Contact lenses, hearing aids and dentures may not be worn into surgery.  Do not bring valuables to the hospital. [facility] is not responsible for any missing/lost belongings or valuables.   Notify your doctor if there is any change in your medical condition (cold, fever, infection).  Wear comfortable clothing (specific to your surgery type) to the hospital.  After surgery, you can help prevent lung complications by doing breathing exercises.  Take deep breaths and cough every 1-2 hours. Your doctor may order a device called an Incentive Spirometer to help you take deep breaths. When coughing or sneezing, hold a pillow firmly against your incision with both hands. This is called splinting. Doing this helps protect your incision. It also decreases belly discomfort.  If you are being discharged the day of surgery, you will not be allowed to drive home. You will need a responsible adult (18 years  or older) to drive you home and stay with you that night.   If you are taking public transportation, you will need to have a responsible adult (18 years or older) with you. Please confirm with your physician that it is acceptable to use public transportation.   Please call the Pre-admissions Testing Dept. at [phone] if you have any questions about these instructions.  Surgery Visitation Policy:  Patients undergoing a  surgery or procedure may have one family member or support person with them as long as that person is not COVID-19 positive or experiencing its symptoms.  That person may remain in the waiting area during the procedure and may rotate out with other people.

## 2021-02-26 ENCOUNTER — Ambulatory Visit
Admission: RE | Admit: 2021-02-26 | Discharge: 2021-02-26 | Disposition: A | Discharge status: Home / Self Care | Payer: BC Managed Care – PPO | Source: Ambulatory Visit | Attending: Surgery | Admitting: Surgery

## 2021-02-26 ENCOUNTER — Ambulatory Visit: Payer: BC Managed Care – PPO | Admitting: Anesthesiology

## 2021-02-26 ENCOUNTER — Encounter: Payer: Self-pay | Admitting: Surgery

## 2021-02-26 ENCOUNTER — Other Ambulatory Visit: Payer: Self-pay

## 2021-02-26 ENCOUNTER — Encounter
Admission: RE | Disposition: A | Discharge status: Home / Self Care | Payer: Self-pay | Source: Ambulatory Visit | Attending: Surgery

## 2021-02-26 DIAGNOSIS — Z302 Encounter for sterilization: Secondary | ICD-10-CM

## 2021-02-26 DIAGNOSIS — J45909 Unspecified asthma, uncomplicated: Secondary | ICD-10-CM | POA: Insufficient documentation

## 2021-02-26 DIAGNOSIS — K4020 Bilateral inguinal hernia, without obstruction or gangrene, not specified as recurrent: Secondary | ICD-10-CM

## 2021-02-26 HISTORY — PX: INSERTION OF MESH: SHX5868

## 2021-02-26 HISTORY — PX: REPAIR, HERNIA, INGUINAL, BILATERAL, ROBOT-ASSISTED: SHX7636

## 2021-02-26 HISTORY — PX: VASECTOMY: SHX75

## 2021-02-26 SURGERY — REPAIR, HERNIA, INGUINAL, BILATERAL, ROBOT-ASSISTED
Anesthesia: General

## 2021-02-26 MED ORDER — ACETAMINOPHEN 500 MG PO TABS: 1000.0000 mg | ORAL_TABLET | ORAL | Status: AC

## 2021-02-26 MED ORDER — MIDAZOLAM HCL 2 MG/2ML IJ SOLN
INTRAMUSCULAR | Status: DC | PRN
  Administered 2021-02-26: 2 mg via INTRAVENOUS

## 2021-02-26 MED ORDER — CEFAZOLIN SODIUM-DEXTROSE 2-4 GM/100ML-% IV SOLN
2.0000 g | INTRAVENOUS | Status: AC
  Administered 2021-02-26: 2 g via INTRAVENOUS

## 2021-02-26 MED ORDER — FAMOTIDINE 20 MG PO TABS: 20.0000 mg | ORAL_TABLET | Freq: Once | ORAL | Status: AC

## 2021-02-26 MED ORDER — DEXAMETHASONE SODIUM PHOSPHATE 10 MG/ML IJ SOLN
INTRAMUSCULAR | Status: AC
  Filled 2021-02-26: qty 1

## 2021-02-26 MED ORDER — MIDAZOLAM HCL 2 MG/2ML IJ SOLN
INTRAMUSCULAR | Status: AC
  Filled 2021-02-26: qty 2

## 2021-02-26 MED ORDER — PROPOFOL 10 MG/ML IV BOLUS
INTRAVENOUS | Status: DC | PRN
Start: 2021-02-26 — End: 2021-02-26
  Administered 2021-02-26: 200 mg via INTRAVENOUS

## 2021-02-26 MED ORDER — GABAPENTIN 300 MG PO CAPS
ORAL_CAPSULE | ORAL | Status: AC
  Administered 2021-02-26: 300 mg via ORAL
  Filled 2021-02-26: qty 1

## 2021-02-26 MED ORDER — DEXMEDETOMIDINE (PRECEDEX) IN NS 20 MCG/5ML (4 MCG/ML) IV SYRINGE
PREFILLED_SYRINGE | INTRAVENOUS | Status: DC | PRN
  Administered 2021-02-26 (×2): 10 ug via INTRAVENOUS

## 2021-02-26 MED ORDER — HYDROCODONE-ACETAMINOPHEN 5-325 MG PO TABS: 1.0000 | ORAL_TABLET | Freq: Four times a day (QID) | ORAL | 0 refills | Status: DC | PRN

## 2021-02-26 MED ORDER — LACTATED RINGERS IV SOLN: INTRAVENOUS | Status: DC

## 2021-02-26 MED ORDER — CELECOXIB 200 MG PO CAPS
ORAL_CAPSULE | ORAL | Status: AC
  Administered 2021-02-26: 200 mg via ORAL
  Filled 2021-02-26: qty 1

## 2021-02-26 MED ORDER — FENTANYL CITRATE (PF) 100 MCG/2ML IJ SOLN
INTRAMUSCULAR | Status: AC
  Filled 2021-02-26: qty 2

## 2021-02-26 MED ORDER — BUPIVACAINE-EPINEPHRINE (PF) 0.25% -1:200000 IJ SOLN
INTRAMUSCULAR | Status: AC
  Filled 2021-02-26: qty 30

## 2021-02-26 MED ORDER — PROMETHAZINE HCL 25 MG/ML IJ SOLN: 6.2500 mg | INTRAMUSCULAR | Status: DC | PRN

## 2021-02-26 MED ORDER — CELECOXIB 200 MG PO CAPS: 200.0000 mg | ORAL_CAPSULE | ORAL | Status: AC

## 2021-02-26 MED ORDER — BUPIVACAINE-EPINEPHRINE (PF) 0.25% -1:200000 IJ SOLN
INTRAMUSCULAR | Status: DC | PRN
  Administered 2021-02-26: 30 mL

## 2021-02-26 MED ORDER — GABAPENTIN 300 MG PO CAPS: 300.0000 mg | ORAL_CAPSULE | ORAL | Status: AC

## 2021-02-26 MED ORDER — HYDROMORPHONE HCL 1 MG/ML IJ SOLN
INTRAMUSCULAR | Status: DC | PRN
  Administered 2021-02-26: .5 mg via INTRAVENOUS

## 2021-02-26 MED ORDER — SEVOFLURANE IN SOLN
RESPIRATORY_TRACT | Status: AC
  Filled 2021-02-26: qty 250

## 2021-02-26 MED ORDER — ESMOLOL HCL 100 MG/10ML IV SOLN
INTRAVENOUS | Status: AC
  Filled 2021-02-26: qty 10

## 2021-02-26 MED ORDER — PROPOFOL 10 MG/ML IV BOLUS
INTRAVENOUS | Status: AC
  Filled 2021-02-26: qty 20

## 2021-02-26 MED ORDER — LIDOCAINE HCL (PF) 2 % IJ SOLN
INTRAMUSCULAR | Status: AC
  Filled 2021-02-26: qty 5

## 2021-02-26 MED ORDER — CHLORHEXIDINE GLUCONATE 0.12 % MT SOLN
OROMUCOSAL | Status: AC
  Administered 2021-02-26: 15 mL via OROMUCOSAL
  Filled 2021-02-26: qty 15

## 2021-02-26 MED ORDER — FAMOTIDINE 20 MG PO TABS
ORAL_TABLET | ORAL | Status: AC
  Administered 2021-02-26: 20 mg via ORAL
  Filled 2021-02-26: qty 1

## 2021-02-26 MED ORDER — HYDROMORPHONE HCL 1 MG/ML IJ SOLN
INTRAMUSCULAR | Status: AC
  Filled 2021-02-26: qty 1

## 2021-02-26 MED ORDER — SUGAMMADEX SODIUM 200 MG/2ML IV SOLN
INTRAVENOUS | Status: DC | PRN
  Administered 2021-02-26: 200 mg via INTRAVENOUS

## 2021-02-26 MED ORDER — CHLORHEXIDINE GLUCONATE 0.12 % MT SOLN: 15.0000 mL | Freq: Once | OROMUCOSAL | Status: AC

## 2021-02-26 MED ORDER — PHENYLEPHRINE HCL (PRESSORS) 10 MG/ML IV SOLN
INTRAVENOUS | Status: AC
  Filled 2021-02-26: qty 1

## 2021-02-26 MED ORDER — BUPIVACAINE LIPOSOME 1.3 % IJ SUSP: 20.0000 mL | Freq: Once | INTRAMUSCULAR | Status: DC

## 2021-02-26 MED ORDER — ONDANSETRON HCL 4 MG/2ML IJ SOLN
INTRAMUSCULAR | Status: DC | PRN
  Administered 2021-02-26: 4 mg via INTRAVENOUS

## 2021-02-26 MED ORDER — CEFAZOLIN SODIUM-DEXTROSE 2-4 GM/100ML-% IV SOLN
INTRAVENOUS | Status: AC
  Filled 2021-02-26: qty 100

## 2021-02-26 MED ORDER — KETAMINE HCL 50 MG/5ML IJ SOSY
PREFILLED_SYRINGE | INTRAMUSCULAR | Status: AC
  Filled 2021-02-26: qty 5

## 2021-02-26 MED ORDER — ONDANSETRON HCL 4 MG/2ML IJ SOLN
INTRAMUSCULAR | Status: AC
  Filled 2021-02-26: qty 2

## 2021-02-26 MED ORDER — PHENYLEPHRINE HCL (PRESSORS) 10 MG/ML IV SOLN
INTRAVENOUS | Status: DC | PRN
  Administered 2021-02-26: 100 ug via INTRAVENOUS

## 2021-02-26 MED ORDER — ROCURONIUM BROMIDE 10 MG/ML (PF) SYRINGE
PREFILLED_SYRINGE | INTRAVENOUS | Status: AC
  Filled 2021-02-26: qty 10

## 2021-02-26 MED ORDER — BUPIVACAINE LIPOSOME 1.3 % IJ SUSP
INTRAMUSCULAR | Status: AC
  Filled 2021-02-26: qty 20

## 2021-02-26 MED ORDER — OXYCODONE HCL 5 MG PO TABS
5.0000 mg | ORAL_TABLET | ORAL | Status: AC
  Administered 2021-02-26: 5 mg via ORAL

## 2021-02-26 MED ORDER — CHLORHEXIDINE GLUCONATE CLOTH 2 % EX PADS: 6.0000 | MEDICATED_PAD | Freq: Once | CUTANEOUS | Status: DC

## 2021-02-26 MED ORDER — ACETAMINOPHEN 500 MG PO TABS
ORAL_TABLET | ORAL | Status: AC
  Administered 2021-02-26: 1000 mg via ORAL
  Filled 2021-02-26: qty 2

## 2021-02-26 MED ORDER — DEXAMETHASONE SODIUM PHOSPHATE 10 MG/ML IJ SOLN
INTRAMUSCULAR | Status: DC | PRN
  Administered 2021-02-26: 10 mg via INTRAVENOUS

## 2021-02-26 MED ORDER — FENTANYL CITRATE (PF) 100 MCG/2ML IJ SOLN
INTRAMUSCULAR | Status: DC | PRN
Start: 2021-02-26 — End: 2021-02-26
  Administered 2021-02-26 (×2): 50 ug via INTRAVENOUS

## 2021-02-26 MED ORDER — KETAMINE HCL 10 MG/ML IJ SOLN
INTRAMUSCULAR | Status: DC | PRN
  Administered 2021-02-26: 20 mg via INTRAVENOUS

## 2021-02-26 MED ORDER — ROCURONIUM BROMIDE 100 MG/10ML IV SOLN
INTRAVENOUS | Status: DC | PRN
  Administered 2021-02-26: 20 mg via INTRAVENOUS
  Administered 2021-02-26: 50 mg via INTRAVENOUS

## 2021-02-26 MED ORDER — FENTANYL CITRATE (PF) 100 MCG/2ML IJ SOLN
25.0000 ug | INTRAMUSCULAR | Status: DC | PRN
  Administered 2021-02-26: 25 ug via INTRAVENOUS

## 2021-02-26 MED ORDER — OXYCODONE HCL 5 MG PO TABS
ORAL_TABLET | ORAL | Status: AC
  Filled 2021-02-26: qty 1

## 2021-02-26 MED ORDER — ORAL CARE MOUTH RINSE: 15.0000 mL | Freq: Once | OROMUCOSAL | Status: AC

## 2021-02-26 MED ORDER — LIDOCAINE HCL (CARDIAC) PF 100 MG/5ML IV SOSY
PREFILLED_SYRINGE | INTRAVENOUS | Status: DC | PRN
  Administered 2021-02-26 (×2): 100 mg via INTRAVENOUS

## 2021-02-26 SURGICAL SUPPLY — 50 items
ADH SKN CLS APL DERMABOND .7 (GAUZE/BANDAGES/DRESSINGS) ×3
APL PRP STRL LF DISP 70% ISPRP (MISCELLANEOUS) ×3
BLADE CLIPPER SURG (BLADE) ×4 IMPLANT
CANNULA CAP OBTURATR AIRSEAL 8 (CAP) ×4 IMPLANT
CHLORAPREP W/TINT 26 (MISCELLANEOUS) ×4 IMPLANT
COVER TIP SHEARS 8 DVNC (MISCELLANEOUS) ×3 IMPLANT
COVER TIP SHEARS 8MM DA VINCI (MISCELLANEOUS) ×1
COVER WAND RF STERILE (DRAPES) ×4 IMPLANT
DEFOGGER SCOPE WARMER CLEARIFY (MISCELLANEOUS) ×4 IMPLANT
DERMABOND ADVANCED (GAUZE/BANDAGES/DRESSINGS) ×1
DERMABOND ADVANCED .7 DNX12 (GAUZE/BANDAGES/DRESSINGS) ×3 IMPLANT
DRAPE 3/4 80X56 (DRAPES) ×3 IMPLANT
DRAPE ARM DVNC X/XI (DISPOSABLE) ×9 IMPLANT
DRAPE COLUMN DVNC XI (DISPOSABLE) ×3 IMPLANT
DRAPE DA VINCI XI ARM (DISPOSABLE) ×3
DRAPE DA VINCI XI COLUMN (DISPOSABLE) ×1
ELECT REM PT RETURN 9FT ADLT (ELECTROSURGICAL) ×4
ELECTRODE REM PT RTRN 9FT ADLT (ELECTROSURGICAL) ×3 IMPLANT
GAUZE 4X4 16PLY ~~LOC~~+RFID DBL (SPONGE) ×4 IMPLANT
GLOVE SURG ORTHO LTX SZ7.5 (GLOVE) ×14 IMPLANT
GOWN STRL REUS W/ TWL LRG LVL3 (GOWN DISPOSABLE) ×9 IMPLANT
GOWN STRL REUS W/TWL LRG LVL3 (GOWN DISPOSABLE) ×12
GRASPER SUT TROCAR 14GX15 (MISCELLANEOUS) IMPLANT
IRRIGATION STRYKERFLOW (MISCELLANEOUS) IMPLANT
IRRIGATOR STRYKERFLOW (MISCELLANEOUS)
IV CATH ANGIO 14GX1.88 NO SAFE (IV SOLUTION) ×4 IMPLANT
IV NS 1000ML (IV SOLUTION)
IV NS 1000ML BAXH (IV SOLUTION) IMPLANT
KIT PINK PAD W/HEAD ARE REST (MISCELLANEOUS) ×4
KIT PINK PAD W/HEAD ARM REST (MISCELLANEOUS) ×3 IMPLANT
LABEL OR SOLS (LABEL) ×4 IMPLANT
MANIFOLD NEPTUNE II (INSTRUMENTS) ×4 IMPLANT
MESH 3DMAX LIGHT 4.1X6.2 LT LR (Mesh General) ×1 IMPLANT
MESH 3DMAX LIGHT 4.1X6.2 RT LR (Mesh General) ×1 IMPLANT
NDL INSUFFLATION 14GA 120MM (NEEDLE) IMPLANT
NEEDLE HYPO 22GX1.5 SAFETY (NEEDLE) ×4 IMPLANT
NEEDLE INSUFFLATION 14GA 120MM (NEEDLE) IMPLANT
PACK LAP CHOLECYSTECTOMY (MISCELLANEOUS) ×4 IMPLANT
SEAL CANN UNIV 5-8 DVNC XI (MISCELLANEOUS) ×6 IMPLANT
SEAL XI 5MM-8MM UNIVERSAL (MISCELLANEOUS) ×2
SET TUBE FILTERED XL AIRSEAL (SET/KITS/TRAYS/PACK) ×4 IMPLANT
SOLUTION ELECTROLUBE (MISCELLANEOUS) ×4 IMPLANT
SUT MNCRL 4-0 (SUTURE) ×4
SUT MNCRL 4-0 27XMFL (SUTURE) ×3
SUT VIC AB 0 CT2 27 (SUTURE) ×4 IMPLANT
SUT VLOC 90 2/L VL 12 GS22 (SUTURE) IMPLANT
SUT VLOC 90 S/L VL9 GS22 (SUTURE) ×4 IMPLANT
SUTURE MNCRL 4-0 27XMF (SUTURE) ×3 IMPLANT
TROCAR Z-THREAD FIOS 11X100 BL (TROCAR) IMPLANT
WATER STERILE IRR 500ML POUR (IV SOLUTION) ×3 IMPLANT

## 2021-02-26 NOTE — Discharge Instructions (Addendum)
AMBULATORY SURGERY  DISCHARGE INSTRUCTIONS   The drugs that you were given will stay in your system until tomorrow so for the next 24 hours you should not:  Drive an automobile Make any legal decisions Drink any alcoholic beverage   You may resume regular meals tomorrow.  Today it is better to start with liquids and gradually work up to solid foods.  You may eat anything you prefer, but it is better to start with liquids, then soup and crackers, and gradually work up to solid foods.   Please notify your doctor immediately if you have any unusual bleeding, trouble breathing, redness and pain at the surgery site, drainage, fever, or pain not relieved by medication.       Please contact your physician with any problems or Same Day Surgery at [phone], Monday through Friday 6 am to 4 pm, or [facility] Main number at [phone].  

## 2021-02-26 NOTE — Transfer of Care (Signed)
Immediate Anesthesia Transfer of Care Note  Patient: Ranon Coven.  Procedure(s) Performed: XI ROBOTIC ASSISTED BILATERAL INGUINAL HERNIA (Bilateral) VASECTOMY INSERTION OF MESH  Patient Location: PACU  Anesthesia Type:General  Level of Consciousness: drowsy  Airway & Oxygen Therapy: Patient Spontanous Breathing and Patient connected to face mask oxygen  Post-op Assessment: Report given to RN and Post -op Vital signs reviewed and stable  Post vital signs: Reviewed and stable  Last Vitals:  Vitals Value Taken Time  BP 115/71 02/26/21 0937  Temp 36.8 C 02/26/21 0933  Pulse 91 02/26/21 0939  Resp 13 02/26/21 0939  SpO2 99 % 02/26/21 0939  Vitals shown include unvalidated device data.  Last Pain:  Vitals:   02/26/21 0933  TempSrc:   PainSc: Asleep      Patients Stated Pain Goal: 0 (02/26/21 3790)  Complications: No notable events documented.

## 2021-02-26 NOTE — Interval H&P Note (Signed)
History and Physical Interval Note:  02/26/2021 7:17 AM  Jeffery Carlson.  has presented today for surgery, with the diagnosis of bilateral inguinal hernia, desire for sterilization.  The various methods of treatment have been discussed with the patient and family. After consideration of risks, benefits and other options for treatment, the patient has consented to  Procedure(s): XI ROBOTIC ASSISTED BILATERAL INGUINAL HERNIA (Bilateral) VASECTOMY (N/A) as a surgical intervention.  The patient's history has been reviewed, patient examined, no change in status, stable for surgery.  I have reviewed the patient's chart and labs.  Questions were answered to the patient's satisfaction.   We reviewed the timing for expectation of sterility.   [name]

## 2021-02-26 NOTE — Op Note (Addendum)
Robotic assisted Laparoscopic Transabdominal bilateral inguinal Hernia Repair with Mesh, and vasectomy.       Pre-operative Diagnosis: Bilateral inguinal Hernia, desire for sterilization.   Post-operative Diagnosis: Same   Procedure: Robotic assisted Laparoscopic  repair of bilateral inguinal hernia(s), resection of vas deferens bilaterally(vasectomy)   Surgeon: Ronny Bacon, M.D., FACS   Anesthesia: GETA   Findings: Large pantaloon right sided inguinal hernia, smaller left sided eventration/indirect hernia.      2 segments of vas deferens excised, the 4 ends were sealed with bipolar cautery.   Right direct hernia contents:  Procedure Details  The patient was seen again in the Holding Room. The benefits, complications, treatment options, and expected outcomes were discussed with the patient. The risks of bleeding, infection, recurrence of symptoms, failure to resolve symptoms, recurrence of hernia, ischemic orchitis, chronic pain syndrome or neuroma, were reviewed again. The likelihood of improving the patient's symptoms with return to their baseline status is good.  The patient and/or family concurred with the proposed plan, giving informed consent.  The patient was taken to Operating Room, identified  and the procedure verified as Laparoscopic Inguinal Hernia Repair. Laterality confirmed.  A Time Out was held and the above information confirmed.   Prior to the induction of general anesthesia, antibiotic prophylaxis was administered. VTE prophylaxis was in place. General endotracheal anesthesia was then administered and tolerated well. After the induction, the abdomen was prepped with Chloraprep and draped in the sterile fashion. The patient was positioned in the supine position.   After local infiltration of quarter percent Marcaine with epinephrine, stab incision was made left upper quadrant.  On the left at Palmer's point, the Veress needle is passed with sensation of the layers to  penetrate the abdominal wall and into the peritoneum.  Saline drop test is confirmed peritoneal placement.  Insufflation is initiated with carbon dioxide to pressures of 15 mmHg. An 8.5 mm port is placed to the left off of the midline, with blunt tipped trocar.  Pneumoperitoneum maintained w/o HD changes using the AirSeal to pressures of 15 mm Hg with CO2. No evidence of bowel injuries.  Two 8.5 mm ports placed under direct vision in each upper quadrant. The laparoscopy revealed large right sided pantaloon and smaller left indirect defect(s).   The robot was brought ot the table and docked in the standard fashion, no collision between arms was observed. Instruments were kept under direct view at all times. For bilateral inguinal hernia repair,  I developed a peritoneal flap. The sac(s) were reduced and dissected free from adjacent structures. We preserved the vas and the vessels, and visualized them to their convergence and beyond in the retroperitoneum.  Right side:  The vas deferens was then isolated from the testicular vasculature, cauterized proximally and distally and assisted large segment excised, retrieved and sent as separate specimens.   Once dissection was completed, and the vasectomy accomplished, a large left and right sided BARD 3D Light mesh was placed and secured at three points with interrupted 0 Vicryl to the pubic tubercle and anteriorly.  Attention then was turned to the opposite, left, side. The sac having also been reduced and dissected free from adjacent structures, in like manner with adequate posterior dissection. The same, but contralateral, left, mesh was placed and secured in like manner with interrupted 0 Vicryl. There was good coverage of the direct, indirect and femoral spaces.  Second look revealed no complications or injuries.   The flap was then closed with 2-0 V-lock suture.  Peritoneal closure without defects.  Once assuring that hemostasis was adequate, all  needles/sponges removed, and the robot was undocked.  A large angiocath is placed under direct visualization in the groin to reduce trapped extraperitoneal air and confirm adequate peritoneal closure.     The ports were removed, the abdomen desulflated.  4-0 subcuticular Monocryl was used at all skin edges. Dermabond was placed.  Patient tolerated the procedure well. There were no complications. He was taken to the recovery room in stable condition.           Ronny Bacon, M.D., FACS 02/26/2021, 9:44 AM

## 2021-02-26 NOTE — Anesthesia Preprocedure Evaluation (Addendum)
Anesthesia Evaluation  Patient identified by MRN, date of birth, ID band Patient awake    Reviewed: Allergy & Precautions, H&P , NPO status , Patient's Chart, lab work & pertinent test results, reviewed documented beta blocker date and time   History of Anesthesia Complications Negative for: history of anesthetic complications  Airway Mallampati: II  TM Distance: >3 FB Neck ROM: full    Dental  (+) Dental Advidsory Given, Missing, Teeth Intact   Pulmonary neg shortness of breath, asthma , neg recent URI,    Pulmonary exam normal breath sounds clear to auscultation       Cardiovascular Exercise Tolerance: Good negative cardio ROS Normal cardiovascular exam Rhythm:regular Rate:Normal     Neuro/Psych negative neurological ROS  negative psych ROS   GI/Hepatic negative GI ROS, Neg liver ROS,   Endo/Other  negative endocrine ROS  Renal/GU negative Renal ROS  negative genitourinary   Musculoskeletal   Abdominal   Peds  Hematology negative hematology ROS (+)   Anesthesia Other Findings Past Medical History: 2019: Asthma No date: Bronchitis 2022: Inguinal hernia, bilateral 2010: Umbilical hernia   Reproductive/Obstetrics negative OB ROS                             Anesthesia Physical Anesthesia Plan  ASA: 2  Anesthesia Plan: General   Post-op Pain Management:    Induction: Intravenous  PONV Risk Score and Plan: 2 and Ondansetron, Dexamethasone, Midazolam, Treatment may vary due to age or medical condition and Promethazine  Airway Management Planned: Oral ETT  Additional Equipment:   Intra-op Plan:   Post-operative Plan: Extubation in OR  Informed Consent: I have reviewed the patients History and Physical, chart, labs and discussed the procedure including the risks, benefits and alternatives for the proposed anesthesia with the patient or authorized representative who has  indicated his/her understanding and acceptance.     Dental Advisory Given  Plan Discussed with: Anesthesiologist, CRNA and Surgeon  Anesthesia Plan Comments:         Anesthesia Quick Evaluation

## 2021-02-26 NOTE — Anesthesia Procedure Notes (Signed)
Procedure Name: Intubation Date/Time: 02/26/2021 7:43 AM Performed by: Loletha Grayer, CRNA Pre-anesthesia Checklist: Patient identified, Emergency Drugs available, Suction available, Patient being monitored and Timeout performed Patient Re-evaluated:Patient Re-evaluated prior to induction Oxygen Delivery Method: Circle system utilized Preoxygenation: Pre-oxygenation with 100% oxygen Induction Type: IV induction Ventilation: Mask ventilation without difficulty Laryngoscope Size: Mac and 3 Grade View: Grade II Tube type: Oral Tube size: 7.5 mm Airway Equipment and Method: Stylet Placement Confirmation: ETT inserted through vocal cords under direct vision, positive ETCO2 and breath sounds checked- equal and bilateral Secured at: 22 cm Tube secured with: Tape Dental Injury: Teeth and Oropharynx as per pre-operative assessment

## 2021-02-27 LAB — SURGICAL PATHOLOGY

## 2021-02-28 ENCOUNTER — Telehealth: Payer: Self-pay | Admitting: *Deleted

## 2021-02-28 NOTE — Progress Notes (Signed)
Sometimes the pictures tell the story better than my words.  It should be clear to all, now, that the repair was bilateral.  Thank you for your attention to the detailed report.

## 2021-02-28 NOTE — Telephone Encounter (Signed)
[facility] Claim to [employer] to [phone]

## 2021-03-01 NOTE — Anesthesia Postprocedure Evaluation (Signed)
Anesthesia Post Note  Patient: Jeffery Carlson.  Procedure(s) Performed: XI ROBOTIC ASSISTED BILATERAL INGUINAL HERNIA (Bilateral) VASECTOMY INSERTION OF MESH  Patient location during evaluation: PACU Anesthesia Type: General Level of consciousness: awake and alert Pain management: pain level controlled Vital Signs Assessment: post-procedure vital signs reviewed and stable Respiratory status: spontaneous breathing, nonlabored ventilation, respiratory function stable and patient connected to nasal cannula oxygen Cardiovascular status: blood pressure returned to baseline and stable Postop Assessment: no apparent nausea or vomiting Anesthetic complications: no   No notable events documented.   Last Vitals:  Vitals:   02/26/21 1057 02/26/21 1116  BP:  118/79  Pulse:  96  Resp:  15  Temp:  (!) 36.1 C  SpO2: 94% 96%    Last Pain:  Vitals:   02/26/21 1116  TempSrc: Temporal  PainSc: 6                  Martha Clan

## 2021-03-11 ENCOUNTER — Ambulatory Visit (INDEPENDENT_AMBULATORY_CARE_PROVIDER_SITE_OTHER): Payer: BC Managed Care – PPO | Admitting: Physician Assistant

## 2021-03-11 ENCOUNTER — Other Ambulatory Visit: Payer: Self-pay

## 2021-03-11 ENCOUNTER — Encounter: Payer: Self-pay | Admitting: Physician Assistant

## 2021-03-11 VITALS — BP 120/88 | HR 111 | Temp 98.5°F | Ht 67.0 in | Wt 208.0 lb

## 2021-03-11 DIAGNOSIS — Z302 Encounter for sterilization: Secondary | ICD-10-CM

## 2021-03-11 DIAGNOSIS — Z09 Encounter for follow-up examination after completed treatment for conditions other than malignant neoplasm: Secondary | ICD-10-CM

## 2021-03-11 DIAGNOSIS — K4020 Bilateral inguinal hernia, without obstruction or gangrene, not specified as recurrent: Secondary | ICD-10-CM

## 2021-03-11 NOTE — Patient Instructions (Signed)
GENERAL POST-OPERATIVE °PATIENT INSTRUCTIONS  ° °WOUND CARE INSTRUCTIONS:  Keep a dry clean dressing on the wound if there is drainage. The initial bandage may be removed after 24 hours.  Once the wound has quit draining you may leave it open to air.  If clothing rubs against the wound or causes irritation and the wound is not draining you may cover it with a dry dressing during the daytime.  Try to keep the wound dry and avoid ointments on the wound unless directed to do so.  If the wound becomes bright red and painful or starts to drain infected material that is not clear, please contact your physician immediately.  If the wound is mildly pink and has a thick firm ridge underneath it, this is normal, and is referred to as a healing ridge.  This will resolve over the next 4-6 weeks. ° °BATHING: °You may shower if you have been informed of this by your surgeon. However, Please do not submerge in a tub, hot tub, or pool until incisions are completely sealed or have been told by your surgeon that you may do so. ° °DIET:  You may eat any foods that you can tolerate.  It is a good idea to eat a high fiber diet and take in plenty of fluids to prevent constipation.  If you do become constipated you may want to take a mild laxative or take ducolax tablets on a daily basis until your bowel habits are regular.  Constipation can be very uncomfortable, along with straining, after recent surgery. ° °ACTIVITY:  You are encouraged to cough and deep breath or use your incentive spirometer if you were given one, every 15-30 minutes when awake.  This will help prevent respiratory complications and low grade fevers post-operatively if you had a general anesthetic.  You may want to hug a pillow when coughing and sneezing to add additional support to the surgical area, if you had abdominal or chest surgery, which will decrease pain during these times.  You are encouraged to walk and engage in light activity for the next two weeks.  You  should not lift more than 20 pounds, until 04/10/2021 as it could put you at increased risk for complications.  Twenty pounds is roughly equivalent to a plastic bag of groceries. At that time- Listen to your body when lifting, if you have pain when lifting, stop and then try again in a few days. Soreness after doing exercises or activities of daily living is normal as you get back in to your normal routine. ° °MEDICATIONS:  Try to take narcotic medications and anti-inflammatory medications, such as tylenol, ibuprofen, naprosyn, etc., with food.  This will minimize stomach upset from the medication.  Should you develop nausea and vomiting from the pain medication, or develop a rash, please discontinue the medication and contact your physician.  You should not drive, make important decisions, or operate machinery when taking narcotic pain medication. ° °SUNBLOCK °Use sun block to incision area over the next year if this area will be exposed to sun. This helps decrease scarring and will allow you avoid a permanent darkened area over your incision. ° °QUESTIONS:  Please feel free to call our office if you have any questions, and we will be glad to assist you. [phone] ° ° °

## 2021-03-11 NOTE — Progress Notes (Signed)
[facility] POST-OP OFFICE VISIT  03/11/2021  HPI: Jeffery Goodbar. is a 46 y.o. male 13 days s/p robotic assisted laparoscopic bilateral inguinal hernia repair and vasectomy with Dr Claudine Mouton   He is doing well Some intermittent mild pains in his groins bilaterally No fever, chills, nausea, emesis, or bowel changes No issues with scrotal swelling/bruising No other complaints   Vital signs: BP 120/88    Pulse (!) 111    Temp 98.5 F (36.9 C) (Oral)    Ht 5\' 7"  (1.702 m)    Wt 208 lb (94.3 kg)    SpO2 97%    BMI 32.58 kg/m    Physical Exam: Constitutional: Well appearing male, NAD Abdomen: Soft, non-tender, non-distended, no rebound/guarding Skin: Laparoscopic incisions are healing well, no erythema or drainage   Assessment/Plan: This is a 46 y.o. male 13 days s/p robotic assisted laparoscopic bilateral inguinal hernia repair and vasectomy    - Pain control prn  - Reviewed wound care recommendation  - Reviewed lifting restrictions; 6 weeks total; work note given   - He can follow up on as needed basis; He understands to call with questions/concerns   - From a vasectomy standpoint, I did remind his that he may not be sterile to this point post-op. I did order a standing post-vasectomy sperm count lab and encouraged him in 204 weeks to get his count tested to ensure sterility.   -- 55, PA-C [facility] 03/11/2021, 3:01 PM [phone] M-F: 7am - 4pm

## 2021-04-14 ENCOUNTER — Telehealth: Payer: Self-pay

## 2021-04-14 NOTE — Telephone Encounter (Signed)
Needs return to work note- Returning  to work 04/15/2021 with no restrictions. Patient to call back withjfax number
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
## 2021-09-22 ENCOUNTER — Emergency Department (HOSPITAL_COMMUNITY): Payer: BC Managed Care – PPO

## 2021-09-22 ENCOUNTER — Encounter (HOSPITAL_COMMUNITY): Payer: Self-pay | Admitting: Emergency Medicine

## 2021-09-22 ENCOUNTER — Other Ambulatory Visit: Payer: Self-pay

## 2021-09-22 ENCOUNTER — Emergency Department (HOSPITAL_COMMUNITY)
Admission: EM | Admit: 2021-09-22 | Discharge: 2021-09-22 | Disposition: A | Discharge status: Home / Self Care | Payer: Worker's Compensation | Attending: Emergency Medicine | Admitting: Emergency Medicine

## 2021-09-22 DIAGNOSIS — M546 Pain in thoracic spine: Secondary | ICD-10-CM | POA: Diagnosis not present

## 2021-09-22 DIAGNOSIS — Y92410 Unspecified street and highway as the place of occurrence of the external cause: Secondary | ICD-10-CM | POA: Diagnosis not present

## 2021-09-22 DIAGNOSIS — J45909 Unspecified asthma, uncomplicated: Secondary | ICD-10-CM | POA: Diagnosis not present

## 2021-09-22 DIAGNOSIS — M545 Low back pain, unspecified: Secondary | ICD-10-CM | POA: Diagnosis not present

## 2021-09-22 DIAGNOSIS — M549 Dorsalgia, unspecified: Secondary | ICD-10-CM

## 2021-09-22 MED ORDER — LIDOCAINE 5 % EX PTCH
1.0000 | MEDICATED_PATCH | CUTANEOUS | Status: DC
  Administered 2021-09-22: 1 via TRANSDERMAL
  Filled 2021-09-22: qty 1

## 2021-09-22 MED ORDER — METHOCARBAMOL 500 MG PO TABS: 500.0000 mg | ORAL_TABLET | Freq: Three times a day (TID) | ORAL | 0 refills | Status: AC | PRN

## 2021-09-22 MED ORDER — LIDOCAINE 5 % EX PTCH
1.0000 | MEDICATED_PATCH | CUTANEOUS | 0 refills | Status: AC
Start: 2021-09-22 — End: ?

## 2021-09-22 NOTE — ED Notes (Signed)
I provided reinforced discharge education based off of after visit summary/care provided. Pt acknowledged and understood my education. Pt had no further questions/concerns for provider/myself. After visit summary provided to pt. 
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
## 2022-09-14 ENCOUNTER — Emergency Department
Admission: EM | Admit: 2022-09-14 | Discharge: 2022-09-15 | Disposition: A | Discharge status: Home / Self Care | Payer: 59 | Attending: Emergency Medicine | Admitting: Emergency Medicine

## 2022-09-14 ENCOUNTER — Other Ambulatory Visit: Payer: Self-pay

## 2022-09-14 ENCOUNTER — Encounter: Payer: Self-pay | Admitting: Emergency Medicine

## 2022-09-14 ENCOUNTER — Emergency Department: Payer: 59

## 2022-09-14 DIAGNOSIS — S0990XA Unspecified injury of head, initial encounter: Secondary | ICD-10-CM | POA: Diagnosis present

## 2022-09-14 DIAGNOSIS — J45909 Unspecified asthma, uncomplicated: Secondary | ICD-10-CM | POA: Diagnosis not present

## 2022-09-14 DIAGNOSIS — W19XXXA Unspecified fall, initial encounter: Secondary | ICD-10-CM | POA: Diagnosis not present

## 2022-09-14 DIAGNOSIS — S065X0A Traumatic subdural hemorrhage without loss of consciousness, initial encounter: Secondary | ICD-10-CM | POA: Diagnosis not present

## 2022-09-14 DIAGNOSIS — S065XAA Traumatic subdural hemorrhage with loss of consciousness status unknown, initial encounter: Secondary | ICD-10-CM

## 2022-09-14 DIAGNOSIS — R55 Syncope and collapse: Secondary | ICD-10-CM | POA: Insufficient documentation

## 2022-09-14 LAB — CBC
HCT: 42.2 % (ref 39.0–52.0)
Hemoglobin: 14.1 g/dL (ref 13.0–17.0)
MCH: 29 pg (ref 26.0–34.0)
MCHC: 33.4 g/dL (ref 30.0–36.0)
MCV: 86.7 fL (ref 80.0–100.0)
Platelets: 260 10*3/uL (ref 150–400)
RBC: 4.87 MIL/uL (ref 4.22–5.81)
RDW: 12.2 % (ref 11.5–15.5)
WBC: 10.2 10*3/uL (ref 4.0–10.5)
nRBC: 0 % (ref 0.0–0.2)

## 2022-09-14 LAB — BASIC METABOLIC PANEL
Anion gap: 7 (ref 5–15)
BUN: 13 mg/dL (ref 6–20)
CO2: 26 mmol/L (ref 22–32)
Calcium: 8.8 mg/dL — ABNORMAL LOW (ref 8.9–10.3)
Chloride: 103 mmol/L (ref 98–111)
Creatinine, Ser: 0.99 mg/dL (ref 0.61–1.24)
GFR, Estimated: >60 mL/min (ref >60)
Glucose, Bld: 154 mg/dL — ABNORMAL HIGH (ref 70–99)
Potassium: 3.6 mmol/L (ref 3.5–5.1)
Sodium: 136 mmol/L (ref 135–145)

## 2022-09-14 LAB — CBG MONITORING, ED: Glucose-Capillary: 141 mg/dL — ABNORMAL HIGH (ref 70–99)

## 2022-09-14 MED ORDER — SODIUM CHLORIDE 0.9 % IV BOLUS
1000.0000 mL | Freq: Once | INTRAVENOUS | Status: AC
  Administered 2022-09-14: 1000 mL via INTRAVENOUS

## 2022-09-14 NOTE — ED Notes (Addendum)
Pt connected to cardiac monitor. Call light within reach. Warm blanket given to pt. Wife at bedside. Pt with no further needs at this time.

## 2022-09-14 NOTE — ED Provider Notes (Incomplete)
[facility] Provider Note    Event Date/Time   First MD Initiated Contact with Patient 09/14/22 2256     (approximate)   History   No chief complaint on file.   HPI  Jeffery Carlson. is a 47 y.o. male with history of asthma presenting to the emergency department for evaluation following a syncopal episode.  For the last several weeks, patient is had an ongoing cough which has been following up with as an outpatient.  Earlier tonight he had a bad coughing episode and felt lightheaded.  He stood up and had a syncopal episode.  He did fall and hit the back of his head on what he thinks was the molding of his floor.  His wife witnessed the episode and thinks that he was passed out for a minute or 2.  No seizure-like activity.  Does have an area of swelling over the back of his head.  No vision changes.  No focal numbness, tingling, weakness.  No fevers.  Had a recent chest x-Kalene Cutler at his primary care office but did not demonstrate pneumonia or other acute findings.    Physical Exam   Triage Vital Signs: ED Triage Vitals  Encounter Vitals Group     BP 09/14/22 2210 (!) 155/99     Systolic BP Percentile --      Diastolic BP Percentile --      Pulse Rate 09/14/22 2210 95     Resp 09/14/22 2210 18     Temp 09/14/22 2210 98.2 F (36.8 C)     Temp Source 09/14/22 2210 Oral     SpO2 09/14/22 2210 98 %     Weight 09/14/22 2207 200 lb (90.7 kg)     Height 09/14/22 2207 5\' 7"  (1.702 m)     Head Circumference --      Peak Flow --      Pain Score --      Pain Loc --      Pain Education --      Exclude from Growth Chart --     Most recent vital signs: Vitals:   09/14/22 2210  BP: (!) 155/99  Pulse: 95  Resp: 18  Temp: 98.2 F (36.8 C)  SpO2: 98%     General: Awake, interactive *** CV:  Regular rate, good peripheral perfusion. *** Resp:  Lungs clear, unlabored respirations. *** Abd:  Soft, nondistended. *** Neuro:  Symmetric facial movement, fluid  speech***   ED Results / Procedures / Treatments   Labs (all labs ordered are listed, but only abnormal results are displayed) Labs Reviewed  BASIC METABOLIC PANEL - Abnormal; Notable for the following components:      Result Value   Glucose, Bld 154 (*)    Calcium 8.8 (*)    All other components within normal limits  CBG MONITORING, ED - Abnormal; Notable for the following components:   Glucose-Capillary 141 (*)    All other components within normal limits  CBC  URINALYSIS, ROUTINE W REFLEX MICROSCOPIC     EKG EKG independently reviewed interpreted by myself (ER attending) demonstrates:  EKG demonstrates sinus tachycardia at a rate of 102, PR 152, QRS 86, QTc 437, no acute ST changes  RADIOLOGY Imaging independently reviewed and interpreted by myself demonstrates:  CT head without small SDH, per radiology 2mm along anterior falx CT C-spine without acute fracture  PROCEDURES:  Critical Care performed: {CriticalCareYesNo:19197::"Yes, see critical care procedure note(s)","No"}  Procedures   MEDICATIONS ORDERED  IN ED: Medications - No data to display   IMPRESSION / MDM / ASSESSMENT AND PLAN / ED COURSE  I reviewed the triage vital signs and the nursing notes.  Differential diagnosis includes, but is not limited to, ***  Patient's presentation is most consistent with {EM COPA:27473}  ***      FINAL CLINICAL IMPRESSION(S) / ED DIAGNOSES   Final diagnoses:  None     Rx / DC Orders   ED Discharge Orders     None        Note:  This document was prepared using Dragon voice recognition software and may include unintentional dictation errors.

## 2022-09-14 NOTE — ED Triage Notes (Signed)
Pt presents ambulatory to triage via POV with complaints of a syncopal episode tonight. Per S/O he had a coughing spell and was having trouble trying to catch his breath and he stood up he passed out. He notes hitting his head - no vision changes, N/V. A&Ox4 at this time. Denies CP or SOB.

## 2022-09-14 NOTE — ED Triage Notes (Deleted)
Patient ambulatory to triage with steady gait, without difficulty or distress noted; pt reports since last Saturday having pain to left lower posterior rib cage that increases with deep breathing; st seen last night and rx meds without relief; hr PTA began having episodes where "heart slowed down then fast"

## 2022-09-14 NOTE — ED Provider Notes (Signed)
[facility] Provider Note    Event Date/Time   First MD Initiated Contact with Patient 09/14/22 2256     (approximate)   History   No chief complaint on file.   HPI  Jeffery Carlson. is a 47 y.o. male with history of asthma presenting to the emergency department for evaluation following a syncopal episode.  For the last several weeks, patient is had an ongoing cough which has been following up with as an outpatient.  Earlier tonight he had a bad coughing episode and felt lightheaded.  He stood up and had a syncopal episode.  He did fall and hit the back of his head on what he thinks was the molding of his floor.  His wife witnessed the episode and thinks that he was passed out for a minute or 2.  No seizure-like activity.  Does have an area of swelling over the back of his head.  No vision changes.  No focal numbness, tingling, weakness.  No fevers.  Had a recent chest x-Michi Herrmann at his primary care office but did not demonstrate pneumonia or other acute findings.    Physical Exam   Triage Vital Signs: ED Triage Vitals  Encounter Vitals Group     BP 09/14/22 2210 (!) 155/99     Systolic BP Percentile --      Diastolic BP Percentile --      Pulse Rate 09/14/22 2210 95     Resp 09/14/22 2210 18     Temp 09/14/22 2210 98.2 F (36.8 C)     Temp Source 09/14/22 2210 Oral     SpO2 09/14/22 2210 98 %     Weight 09/14/22 2207 200 lb (90.7 kg)     Height 09/14/22 2207 5\' 7"  (1.702 m)     Head Circumference --      Peak Flow --      Pain Score --      Pain Loc --      Pain Education --      Exclude from Growth Chart --     Most recent vital signs: Vitals:   09/15/22 0330 09/15/22 0500  BP: 107/64 120/79  Pulse:    Resp:    Temp:    SpO2:       General: Awake, interactive  Head:  Large area of palpable swelling over the posterior head consistent with a hematoma, no open areas of skin, head otherwise atraumatic CV:  Regular rate, good peripheral  perfusion.  Resp:  Lungs clear, unlabored respirations.  Abd:  Soft, nondistended.  Neuro:  Alert and oriented, normal extraocular movements, symmetric facial movement, sensation intact over bilateral upper and lower extremities with 5 out of 5 strength.  Normal finger-to-nose testing.   ED Results / Procedures / Treatments   Labs (all labs ordered are listed, but only abnormal results are displayed) Labs Reviewed  BASIC METABOLIC PANEL - Abnormal; Notable for the following components:      Result Value   Glucose, Bld 154 (*)    Calcium 8.8 (*)    All other components within normal limits  URINALYSIS, ROUTINE W REFLEX MICROSCOPIC - Abnormal; Notable for the following components:   Color, Urine YELLOW (*)    APPearance CLEAR (*)    All other components within normal limits  CBG MONITORING, ED - Abnormal; Notable for the following components:   Glucose-Capillary 141 (*)    All other components within normal limits  CBC  EKG EKG independently reviewed interpreted by myself (ER attending) demonstrates:  EKG demonstrates sinus tachycardia at a rate of 102, PR 152, QRS 86, QTc 437, no acute ST changes  RADIOLOGY Imaging independently reviewed and interpreted by myself demonstrates:  CT head without small SDH, per radiology 2mm along anterior falx CT C-spine without acute fracture  PROCEDURES:  Critical Care performed: No  Procedures   MEDICATIONS ORDERED IN ED: Medications  sodium chloride 0.9 % bolus 1,000 mL (0 mLs Intravenous Stopped 09/15/22 0030)     IMPRESSION / MDM / ASSESSMENT AND PLAN / ED COURSE  I reviewed the triage vital signs and the nursing notes.  Differential diagnosis includes, but is not limited to, clinical history suggestive of vasovagal syncope secondary to cough, consideration for acute traumatic injury including intracranial bleed, spine fracture, no evidence of thoracoabdominal trauma on exam  Patient's presentation is most consistent with  acute presentation with potential threat to life or bodily function.  47 year old male presenting following a syncopal episode with head trauma.  Labs and urine reassuring.  CT head pending.  Clinical Course as of 09/15/22 1191  Mon Sep 14, 2022  2349 Notified by radiology that patient CT demonstrated a 2 mm subdural hematoma along the falx.  Patient neuro intact on exam.  Case discussed with Dr. Myer Haff.  He recommends a 6-hour repeat head CT.  If this is stable, patient can be discharged.  Patient updated and comfortable with this plan.  Repeat head CT ordered. [NR]  Tue Sep 15, 2022  4782 Repeat head CT resulted without any progression in patient's previously noted small subdural hematoma.  Per discussion with neurosurgery earlier today, will plan for discharge patient with strict return precautions. [NR]    Clinical Course User Index [NR] Trinna Post, MD   As patient was being discharged, I was notified by Dr. Marcell Barlow that he did recommend I send a week of Keppra to the patient's pharmacy.  Patient updated on this and prescription sent.  Patient discharged in stable condition.   FINAL CLINICAL IMPRESSION(S) / ED DIAGNOSES   Final diagnoses:  Syncope and collapse  Subdural hematoma (HCC)     Rx / DC Orders   ED Discharge Orders          Ordered    levETIRAcetam (KEPPRA) 500 MG tablet  2 times daily        09/15/22 0604             Note:  This document was prepared using Dragon voice recognition software and may include unintentional dictation errors.   Trinna Post, MD 09/15/22 [id number]

## 2022-09-15 ENCOUNTER — Emergency Department: Payer: 59

## 2022-09-15 MED ORDER — LEVETIRACETAM 500 MG PO TABS: 500.0000 mg | ORAL_TABLET | Freq: Two times a day (BID) | ORAL | 0 refills | Status: AC

## 2022-09-15 NOTE — Discharge Instructions (Addendum)
You were seen in the emergency room today for evaluation after passing out.  I suspect that this is likely related to your your coughing.  Please continue to follow-up with your primary care doctor about this.  Your CT of your head did show a small brain bleed called a subdural hematoma.  Fortunately this was stable when we repeated your CT. I have sent a prescription for a medication to prevent seizures to your pharmacy.  Please take this for the next week.  I included information to follow-up with neurosurgery.  Return to the ER for new or worsening symptoms including severe headache, new numbness, tingling, weakness, repeated episodes of passing out, or any other new or concerning symptoms.

## 2022-09-16 ENCOUNTER — Telehealth: Payer: Self-pay | Admitting: Neurosurgery

## 2022-09-16 DIAGNOSIS — S065XAA Traumatic subdural hemorrhage with loss of consciousness status unknown, initial encounter: Secondary | ICD-10-CM

## 2022-09-16 NOTE — Telephone Encounter (Signed)
Can someone please place the CT order and I will then contact the patient. Thanks

## 2022-09-16 NOTE — Telephone Encounter (Signed)
Head CT has been placed. In how many week should he have this repeated? 4 weeks?

## 2022-09-16 NOTE — Telephone Encounter (Signed)
Jeffery Carlson 4 weeks

## 2022-09-16 NOTE — Telephone Encounter (Signed)
4 weeks right?

## 2022-09-16 NOTE — Telephone Encounter (Signed)
-----   Message from [facility] sent at 09/16/2022 11:44 AM EDT ----- Regarding: RE: ER fu New CT and either telephone or new patient with me or Dr. Katrinka Blazing ----- Message ----- From: Rockey Situ Sent: 09/15/2022   3:43 PM EDT To: Venetia Night, MD Subject: ER fu                                          Should this patient folllow-up in the office with a new CT scan also?

## 2022-09-18 NOTE — Telephone Encounter (Signed)
Patient is aware that Dr.Yarbrough would like a new scan in 4 weeks and patient agreed to a call back.

## 2022-10-05 ENCOUNTER — Ambulatory Visit
Admission: RE | Admit: 2022-10-05 | Discharge: 2022-10-05 | Disposition: A | Discharge status: Home / Self Care | Payer: 59 | Source: Ambulatory Visit | Attending: Neurosurgery | Admitting: Neurosurgery

## 2022-10-05 DIAGNOSIS — S065XAA Traumatic subdural hemorrhage with loss of consciousness status unknown, initial encounter: Secondary | ICD-10-CM | POA: Diagnosis not present

## 2022-10-20 ENCOUNTER — Ambulatory Visit (INDEPENDENT_AMBULATORY_CARE_PROVIDER_SITE_OTHER): Payer: 59 | Admitting: Neurosurgery

## 2022-10-20 DIAGNOSIS — S065XAA Traumatic subdural hemorrhage with loss of consciousness status unknown, initial encounter: Secondary | ICD-10-CM

## 2022-10-20 NOTE — Progress Notes (Signed)
I spoke with Mr. Botbyl regarding his CT scan findings, which are copied below.  He and his primary doctor are concerned that he may be having worsening asthma as he has had multiple coughing fits and this could be related to the reason he got the traumatic intracranial hemorrhage.  He is currently back to his baseline.  He has no complaints today.  From my standpoint, he is cleared from this injury.  He does not need ongoing imaging follow-up.  I will see him back on an as-needed basis.  This visit was performed via telephone.  Patient location: home Provider location: office  I spent a total of 4 minutes non-face-to-face activities for this visit on the date of this encounter including review of current clinical condition and response to treatment.  The patient is aware of and accepts the limits of this telehealth visit.     CT Head 10/05/2022 IMPRESSION: Normal head CT.     Electronically Signed   By: Tiburcio Pea M.D.   On: 10/20/2022 07:12

## 2022-10-20 NOTE — Telephone Encounter (Signed)
His results just came in today. Can I add him as a telephone this week with you?

## 2024-01-21 ENCOUNTER — Encounter: Payer: Self-pay | Admitting: Surgery

## 2024-04-21 ENCOUNTER — Ambulatory Visit: Admit: 2024-04-21 | Admitting: Gastroenterology
